# Patient Record
Sex: Female | Born: 1980 | Race: Black or African American | Hispanic: No | Marital: Single | Smoking: Current every day smoker
Health system: Southern US, Community
[De-identification: ages and names within clinical notes are randomized; demographics above are authoritative.]

## PROBLEM LIST (undated history)

## (undated) ENCOUNTER — Emergency Department (HOSPITAL_COMMUNITY): Payer: Managed Care, Other (non HMO) | Source: Home / Self Care

## (undated) DIAGNOSIS — I10 Essential (primary) hypertension: Secondary | ICD-10-CM

## (undated) DIAGNOSIS — L309 Dermatitis, unspecified: Secondary | ICD-10-CM

## (undated) HISTORY — PX: KNEE SURGERY: SHX244

---
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
## 2002-01-26 ENCOUNTER — Emergency Department (HOSPITAL_COMMUNITY): Admission: EM | Admit: 2002-01-26 | Discharge: 2002-01-26 | Payer: Self-pay | Admitting: Emergency Medicine

## 2003-03-19 ENCOUNTER — Emergency Department (HOSPITAL_COMMUNITY): Admission: EM | Admit: 2003-03-19 | Discharge: 2003-03-19 | Payer: Self-pay

## 2003-10-16 ENCOUNTER — Emergency Department (HOSPITAL_COMMUNITY): Admission: EM | Admit: 2003-10-16 | Discharge: 2003-10-16 | Payer: Self-pay | Admitting: Emergency Medicine

## 2005-03-18 ENCOUNTER — Emergency Department (HOSPITAL_COMMUNITY): Admission: EM | Admit: 2005-03-18 | Discharge: 2005-03-18 | Payer: Self-pay | Admitting: Emergency Medicine

## 2005-11-04 ENCOUNTER — Emergency Department (HOSPITAL_COMMUNITY): Admission: EM | Admit: 2005-11-04 | Discharge: 2005-11-04 | Payer: Self-pay | Admitting: Emergency Medicine

## 2008-03-07 ENCOUNTER — Emergency Department (HOSPITAL_COMMUNITY): Admission: EM | Admit: 2008-03-07 | Discharge: 2008-03-08 | Payer: Self-pay | Admitting: Emergency Medicine

## 2011-08-19 ENCOUNTER — Emergency Department (HOSPITAL_COMMUNITY)
Admission: EM | Admit: 2011-08-19 | Discharge: 2011-08-19 | Disposition: A | Discharge status: Home / Self Care | Payer: Managed Care, Other (non HMO) | Attending: Emergency Medicine | Admitting: Emergency Medicine

## 2011-08-19 ENCOUNTER — Encounter (HOSPITAL_COMMUNITY): Payer: Self-pay

## 2011-08-19 ENCOUNTER — Emergency Department (HOSPITAL_COMMUNITY): Payer: Managed Care, Other (non HMO)

## 2011-08-19 DIAGNOSIS — N898 Other specified noninflammatory disorders of vagina: Secondary | ICD-10-CM | POA: Insufficient documentation

## 2011-08-19 DIAGNOSIS — R5381 Other malaise: Secondary | ICD-10-CM | POA: Insufficient documentation

## 2011-08-19 DIAGNOSIS — R42 Dizziness and giddiness: Secondary | ICD-10-CM | POA: Insufficient documentation

## 2011-08-19 DIAGNOSIS — N939 Abnormal uterine and vaginal bleeding, unspecified: Secondary | ICD-10-CM

## 2011-08-19 DIAGNOSIS — R10819 Abdominal tenderness, unspecified site: Secondary | ICD-10-CM | POA: Insufficient documentation

## 2011-08-19 DIAGNOSIS — R5383 Other fatigue: Secondary | ICD-10-CM | POA: Insufficient documentation

## 2011-08-19 DIAGNOSIS — R040 Epistaxis: Secondary | ICD-10-CM | POA: Insufficient documentation

## 2011-08-19 HISTORY — DX: Dermatitis, unspecified: L30.9

## 2011-08-19 LAB — URINE MICROSCOPIC-ADD ON

## 2011-08-19 LAB — BASIC METABOLIC PANEL
CO2: 24 mEq/L (ref 19–32)
Chloride: 102 mEq/L (ref 96–112)
Creatinine, Ser: 0.7 mg/dL (ref 0.50–1.10)
GFR calc Af Amer: >90 mL/min (ref >90)
GFR calc non Af Amer: >90 mL/min (ref >90)
Glucose, Bld: 90 mg/dL (ref 70–99)
Potassium: 3.9 mEq/L (ref 3.5–5.1)
Sodium: 135 mEq/L (ref 135–145)

## 2011-08-19 LAB — URINALYSIS, ROUTINE W REFLEX MICROSCOPIC
Glucose, UA: NEGATIVE mg/dL
Nitrite: POSITIVE — AB
Protein, ur: 30 mg/dL — AB
pH: 7 (ref 5.0–8.0)

## 2011-08-19 LAB — CBC
Hemoglobin: 12.4 g/dL (ref 12.0–15.0)
MCH: 27.4 pg (ref 26.0–34.0)
MCHC: 34.3 g/dL (ref 30.0–36.0)
Platelets: 248 10*3/uL (ref 150–400)
RBC: 4.53 MIL/uL (ref 3.87–5.11)

## 2011-08-19 LAB — DIFFERENTIAL
Basophils Relative: 0 % (ref 0–1)
Eosinophils Absolute: 0 10*3/uL (ref 0.0–0.7)
Lymphocytes Relative: 29 % (ref 12–46)
Monocytes Relative: 26 % — ABNORMAL HIGH (ref 3–12)
Neutro Abs: 1.1 10*3/uL — ABNORMAL LOW (ref 1.7–7.7)
Neutrophils Relative %: 44 % (ref 43–77)

## 2011-08-19 LAB — PREGNANCY, URINE: Preg Test, Ur: NEGATIVE

## 2011-08-19 MED ORDER — MEDROXYPROGESTERONE ACETATE 5 MG PO TABS: 10.0000 mg | ORAL_TABLET | Freq: Every day | ORAL | Status: DC

## 2011-08-19 MED ORDER — OXYCODONE-ACETAMINOPHEN 5-325 MG PO TABS
2.0000 | ORAL_TABLET | Freq: Once | ORAL | Status: AC
  Administered 2011-08-19: 2 via ORAL
  Filled 2011-08-19: qty 2

## 2011-08-19 MED ORDER — MORPHINE SULFATE 4 MG/ML IJ SOLN
4.0000 mg | Freq: Once | INTRAMUSCULAR | Status: AC
  Administered 2011-08-19: 4 mg via INTRAMUSCULAR
  Filled 2011-08-19: qty 1

## 2011-08-19 NOTE — ED Notes (Addendum)
Pt to ultrasound  Pt alert and oriented x4. Respirations even and unlabored, bilateral symmetrical rise and fall of chest. Skin warm and dry. In no acute distress. Denies needs.   

## 2011-08-19 NOTE — ED Provider Notes (Signed)
History     CSN: [id number]  Arrival date & time 08/19/11  1246   First MD Initiated Contact with Patient 08/19/11 1505      Chief Complaint  Patient presents with  . Vaginal Bleeding  . Epistaxis    (Consider location/radiation/quality/duration/timing/severity/associated sxs/prior treatment) Patient is a 31 y.o. female presenting with vaginal bleeding and nosebleeds. The history is provided by the patient.  Vaginal Bleeding Associated symptoms include abdominal pain. Pertinent negatives include no shortness of breath.  Epistaxis    patient is a 31 year old, female, complains of lower abdominal pain, back pain, and vaginal bleeding.  She states that she had 3 weeks of continuous vaginal bleeding.  That stopped for a week and then recurred today.  She also had a single episode of epistaxis that has resolved.  She says that she used a tampon and 3 panty liners.  They became soaked and then, she had bleeding that went down her legs.  She changed her close and then it recurred again.  Therefore, she came to the emergency department for evaluation.  She denies shortness of breath.  She has had a similar episode a few years ago.  She denies nausea, vomiting, fevers, chills, or dysuria, hematuria, or for blood in her stool.  She has asthma and eczema, but no other past medical history.  She takes acetaminophen for pain, but not any nonsteroidal medicines aspirin or anticoagulants.  She denies a history of endometriosis, fibroids.  She states that her mother and sisters do not have any known gynecological problems  Past Medical History  Diagnosis Date  . Asthma   . Eczema     Past Surgical History  Procedure Date  . Knee surgery   . Cesarean section     History reviewed. No pertinent family history.  History  Substance Use Topics  . Smoking status: Current Everyday Smoker  . Smokeless tobacco: Not on file  . Alcohol Use: Yes     occasionally    OB History    Grav Para Term  Preterm Abortions TAB SAB Ect Mult Living                  Review of Systems  Constitutional: Negative for fever and chills.  HENT: Positive for nosebleeds.   Respiratory: Negative for cough, choking and shortness of breath.   Gastrointestinal: Positive for abdominal pain. Negative for nausea, vomiting and blood in stool.  Genitourinary: Positive for vaginal bleeding. Negative for vaginal discharge and vaginal pain.  Musculoskeletal: Positive for back pain.  Skin: Negative for rash.  Neurological: Positive for weakness and light-headedness.  Hematological: Does not bruise/bleed easily.  All other systems reviewed and are negative.    Allergies  Ibuprofen  Home Medications   Current Outpatient Rx  Name Route Sig Dispense Refill  . ACETAMINOPHEN 500 MG PO TABS Oral Take 1,000 mg by mouth every 6 (six) hours as needed. For pain.    Marland Kitchen VITAMIN B-12 1000 MCG PO TABS Oral Take 1,000 mcg by mouth daily.      BP 118/83  Pulse 85  Temp(Src) 98.4 F (36.9 C) (Oral)  Resp 16  SpO2 100%  Physical Exam  Vitals reviewed. Constitutional: She is oriented to person, place, and time. She appears well-developed and well-nourished.  HENT:  Head: Normocephalic and atraumatic.  Eyes: Conjunctivae are normal.  Neck: Normal range of motion. Neck supple.  Cardiovascular: Normal rate.   No murmur heard. Pulmonary/Chest: Effort normal and breath sounds normal. No respiratory  distress.  Abdominal: Soft. She exhibits no distension. There is tenderness. There is no rebound and no guarding.       Mild Bilateral and suprapubic tenderness, with no masses or peritoneal signs  Musculoskeletal: Normal range of motion.  Neurological: She is alert and oriented to person, place, and time.  Skin: Skin is warm and dry.  Psychiatric: She has a normal mood and affect. Thought content normal.    ED Course  Procedures (including critical care time) Profuse vaginal bleeding, with mild lightheadedness, and  mild lower abdominal tenderness.  Will check pregnancy, status, and blood tests, because of the degree of bleeding.  She reports.  Since she has no local OB/GYN, also, we'll do an ultrasound for further evaluation.  She is neither tachycardic nor hypertensive and does not have an acute abdomen so acute intervention is not indicated at this time   Labs Reviewed  PREGNANCY, URINE  URINALYSIS, ROUTINE W REFLEX MICROSCOPIC  CBC  DIFFERENTIAL  BASIC METABOLIC PANEL   No results found.   No diagnosis found.  5:55 PM i spoke with the md on call for gyn, explained presentation, findings on exam, and ancillary tests including [place], blood and neg hcg along with nl vs.   i suggested tx with provera and [facility]  He agreed.    MDM  Vaginal bleeding No pregnancy, hypotension, anemia, distress       Cheri Guppy, MD 08/19/11 1800

## 2011-08-19 NOTE — Discharge Instructions (Signed)
Your blood tests do not show any signs of significant bleeding.  Your ultrasound does not show any tumors masses fibroids or evidence of endometriosis.  Use Provera to stop your bleeding.  Followup with OB/GYN teaching service at the [facility] in [place].  For repeat evaluation and return for worse or uncontrolled symptoms

## 2011-08-19 NOTE — ED Notes (Signed)
Pt verbally aggressive towards dr. Dr explained in detail why naproxen was the correct pain medication for pt to take. Dr explain importance of following up with a specialist. Pt requested copy of all test and results. RN provided pt with results of both ultrasounds and blood work.

## 2011-08-19 NOTE — ED Notes (Signed)
Pt had menstrual cycle 3 weeks ago, pt has continuous vaginal bleeding since then, pt bled through 4 pad and 1 tampon today. Pain at back and pelvic area, lower abdomen, cramping, blood clot unknown, no odor, Denied any urinary symptoms. Pt sexually active.

## 2011-11-01 ENCOUNTER — Encounter (HOSPITAL_COMMUNITY): Payer: Self-pay | Admitting: *Deleted

## 2011-11-01 ENCOUNTER — Emergency Department (HOSPITAL_COMMUNITY)
Admission: EM | Admit: 2011-11-01 | Discharge: 2011-11-01 | Disposition: A | Discharge status: Home / Self Care | Payer: Managed Care, Other (non HMO) | Attending: Emergency Medicine | Admitting: Emergency Medicine

## 2011-11-01 DIAGNOSIS — F17200 Nicotine dependence, unspecified, uncomplicated: Secondary | ICD-10-CM | POA: Insufficient documentation

## 2011-11-01 DIAGNOSIS — L089 Local infection of the skin and subcutaneous tissue, unspecified: Secondary | ICD-10-CM

## 2011-11-01 DIAGNOSIS — R21 Rash and other nonspecific skin eruption: Secondary | ICD-10-CM | POA: Insufficient documentation

## 2011-11-01 DIAGNOSIS — K5900 Constipation, unspecified: Secondary | ICD-10-CM

## 2011-11-01 LAB — CBC
HCT: 37.5 % (ref 36.0–46.0)
Hemoglobin: 12.4 g/dL (ref 12.0–15.0)
MCH: 27.8 pg (ref 26.0–34.0)
MCHC: 33.1 g/dL (ref 30.0–36.0)
MCV: 84.1 fL (ref 78.0–100.0)
Platelets: 268 10*3/uL (ref 150–400)
RBC: 4.46 MIL/uL (ref 3.87–5.11)
RDW: 13.7 % (ref 11.5–15.5)
WBC: 11.6 10*3/uL — ABNORMAL HIGH (ref 4.0–10.5)

## 2011-11-01 LAB — BASIC METABOLIC PANEL
BUN: 11 mg/dL (ref 6–23)
CO2: 22 mEq/L (ref 19–32)
Calcium: 9.2 mg/dL (ref 8.4–10.5)
Chloride: 101 mEq/L (ref 96–112)
Creatinine, Ser: 0.78 mg/dL (ref 0.50–1.10)
GFR calc Af Amer: >90 mL/min (ref >90)
GFR calc non Af Amer: >90 mL/min (ref >90)
Glucose, Bld: 91 mg/dL (ref 70–99)
Potassium: 4.5 mEq/L (ref 3.5–5.1)
Sodium: 134 mEq/L — ABNORMAL LOW (ref 135–145)

## 2011-11-01 LAB — URINALYSIS, ROUTINE W REFLEX MICROSCOPIC
Bilirubin Urine: NEGATIVE
Glucose, UA: NEGATIVE mg/dL
Hgb urine dipstick: NEGATIVE
Ketones, ur: NEGATIVE mg/dL
Nitrite: NEGATIVE
Protein, ur: NEGATIVE mg/dL
Specific Gravity, Urine: 1.01 (ref 1.005–1.030)
Urobilinogen, UA: 0.2 mg/dL (ref 0.0–1.0)
pH: 7.5 (ref 5.0–8.0)

## 2011-11-01 LAB — URINE MICROSCOPIC-ADD ON

## 2011-11-01 LAB — PREGNANCY, URINE: Preg Test, Ur: NEGATIVE

## 2011-11-01 MED ORDER — PREDNISONE 50 MG PO TABS: 50.0000 mg | ORAL_TABLET | Freq: Every day | ORAL | Status: AC

## 2011-11-01 MED ORDER — SODIUM CHLORIDE 0.9 % IV BOLUS (SEPSIS)
1000.0000 mL | Freq: Once | INTRAVENOUS | Status: AC
  Administered 2011-11-01: 1000 mL via INTRAVENOUS

## 2011-11-01 MED ORDER — LACTULOSE 10 GM/15ML PO SOLN: 10.0000 g | Freq: Two times a day (BID) | ORAL | Status: AC

## 2011-11-01 MED ORDER — DOCUSATE SODIUM 100 MG PO CAPS: 100.0000 mg | ORAL_CAPSULE | Freq: Two times a day (BID) | ORAL | Status: AC

## 2011-11-01 MED ORDER — DOXYCYCLINE HYCLATE 100 MG PO CAPS: 100.0000 mg | ORAL_CAPSULE | Freq: Two times a day (BID) | ORAL | Status: AC

## 2011-11-01 NOTE — ED Notes (Addendum)
Pt states that she was told by another physician that she has some rare form of chicken pox that you get from a sandbox, PA notified.

## 2011-11-01 NOTE — ED Notes (Signed)
Patient is alert and orietned x3.  She is complaining of breakout rash on her face. A secondary complaint of constipation.  Last BM was a small one this morning but She states that she has not had a BM in almost 2 weeks.   A third complaint of multiple boils on her buttocks.  She current states she has  Lower abdominal pain rated 7 of 10 with no radiation of pain with no nausea

## 2011-11-01 NOTE — ED Provider Notes (Signed)
History     CSN: [id number]  Arrival date & time 11/01/11  2952   First MD Initiated Contact with Patient 11/01/11 [phone]      Chief Complaint  Patient presents with  . Rash    face  . Constipation  . Recurrent Skin Infections    Boil    (Consider location/radiation/quality/duration/timing/severity/associated sxs/prior treatment) HPI Patient presents to the emergency department with multiple complaints.  Patient, states that she is having a rash to her bilateral cheeks for the last 3 days else is, sick.  She has not had a bowel movement in the last 2 weeks.  She also states she has, what she feels like are forming boils on her buttocks bilaterally.  Patient denies, fevers, chest pain, shortness of breath, weakness, nausea, vomiting, diarrhea, abdominal pain, or dizziness.  Patient, states she has had a few small bowel movements, but not normal for her.  Patient, is, actually having pain in the inguinal region bilaterally, not in her abdomen. Past Medical History  Diagnosis Date  . Asthma   . Eczema     Past Surgical History  Procedure Date  . Knee surgery   . Cesarean section     History reviewed. No pertinent family history.  History  Substance Use Topics  . Smoking status: Current Everyday Smoker  . Smokeless tobacco: Not on file  . Alcohol Use: Yes     occasionally    OB History    Grav Para Term Preterm Abortions TAB SAB Ect Mult Living                  Review of Systems  Constitutional: Positive for chills. Negative for fever and fatigue.  HENT: Negative for neck stiffness.   Respiratory: Negative for shortness of breath.   Cardiovascular: Negative for chest pain.  Gastrointestinal: Positive for constipation. Negative for nausea, vomiting, abdominal pain, diarrhea, abdominal distention and anal bleeding.  Genitourinary: Negative for dysuria, urgency, frequency and flank pain.  Skin: Positive for rash.  Neurological: Negative for dizziness.    Allergies    Brompheniramine  Home Medications   Current Outpatient Rx  Name Route Sig Dispense Refill  . ACETAMINOPHEN 500 MG PO TABS Oral Take 1,000 mg by mouth every 6 (six) hours as needed. For pain.    Marland Kitchen VITAMIN B-12 1000 MCG PO TABS Oral Take 1,000 mcg by mouth daily.      BP 118/83  Pulse 94  Temp 99 F (37.2 C) (Oral)  Resp 18  SpO2 100%  LMP 10/24/2011  Physical Exam  Constitutional: She is oriented to person, place, and time. She appears well-developed and well-nourished. No distress.  HENT:  Head: Normocephalic and atraumatic.  Eyes: Pupils are equal, round, and reactive to light.  Cardiovascular: Normal rate, regular rhythm and normal heart sounds.  Exam reveals no friction rub.   No murmur heard. Pulmonary/Chest: Effort normal and breath sounds normal.  Abdominal: Soft. Bowel sounds are normal. She exhibits no distension. There is no tenderness. There is no rebound and no guarding.  Lymphadenopathy:       Right: Inguinal adenopathy present.       Left: Inguinal adenopathy present.  Neurological: She is alert and oriented to person, place, and time.  Skin: Skin is warm and dry. Rash noted.       ED Course  Procedures (including critical care time)  Labs Reviewed  CBC - Abnormal; Notable for the following:    WBC 11.6 (*)  All other components within normal limits  BASIC METABOLIC PANEL - Abnormal; Notable for the following:    Sodium 134 (*)     All other components within normal limits  URINALYSIS, ROUTINE W REFLEX MICROSCOPIC - Abnormal; Notable for the following:    Leukocytes, UA SMALL (*)     All other components within normal limits  PREGNANCY, URINE  URINE MICROSCOPIC-ADD ON    The patient will be treated for possible skin infection along with the rash on her face. The patient has been stable here in the ER. She is advised to increase her fluid intake. Told to return here as needed. Patient given IV fluids to help with her constipation.The patient has  tried OTC laxatives but has gotten no relief.   MDM          Carlyle Dolly, PA-C 11/01/11 1058

## 2011-11-04 NOTE — ED Provider Notes (Signed)
Medical screening examination/treatment/procedure(s) were performed by non-physician practitioner and as supervising physician I was immediately available for consultation/collaboration.  Derwood Kaplan, MD 11/04/11 1850

## 2011-11-04 NOTE — ED Provider Notes (Signed)
Medical screening examination/treatment/procedure(s) were performed by non-physician practitioner and as supervising physician I was immediately available for consultation/collaboration.  Derwood Kaplan, MD 11/04/11 1906

## 2012-03-13 ENCOUNTER — Encounter (HOSPITAL_COMMUNITY): Payer: Self-pay | Admitting: Emergency Medicine

## 2012-03-13 ENCOUNTER — Emergency Department (HOSPITAL_COMMUNITY): Payer: Managed Care, Other (non HMO)

## 2012-03-13 ENCOUNTER — Emergency Department (HOSPITAL_COMMUNITY)
Admission: EM | Admit: 2012-03-13 | Discharge: 2012-03-13 | Disposition: A | Discharge status: Home / Self Care | Payer: Managed Care, Other (non HMO) | Attending: Emergency Medicine | Admitting: Emergency Medicine

## 2012-03-13 DIAGNOSIS — Z872 Personal history of diseases of the skin and subcutaneous tissue: Secondary | ICD-10-CM | POA: Insufficient documentation

## 2012-03-13 DIAGNOSIS — R071 Chest pain on breathing: Secondary | ICD-10-CM | POA: Insufficient documentation

## 2012-03-13 DIAGNOSIS — R0789 Other chest pain: Secondary | ICD-10-CM

## 2012-03-13 DIAGNOSIS — J45909 Unspecified asthma, uncomplicated: Secondary | ICD-10-CM | POA: Insufficient documentation

## 2012-03-13 DIAGNOSIS — A599 Trichomoniasis, unspecified: Secondary | ICD-10-CM

## 2012-03-13 DIAGNOSIS — Z79899 Other long term (current) drug therapy: Secondary | ICD-10-CM | POA: Insufficient documentation

## 2012-03-13 DIAGNOSIS — F17200 Nicotine dependence, unspecified, uncomplicated: Secondary | ICD-10-CM | POA: Insufficient documentation

## 2012-03-13 LAB — URINALYSIS, ROUTINE W REFLEX MICROSCOPIC
Nitrite: NEGATIVE
Specific Gravity, Urine: 1.028 (ref 1.005–1.030)
pH: 6.5 (ref 5.0–8.0)

## 2012-03-13 LAB — URINE MICROSCOPIC-ADD ON

## 2012-03-13 MED ORDER — SODIUM CHLORIDE 0.9 % IV SOLN: INTRAVENOUS | Status: DC

## 2012-03-13 MED ORDER — PANTOPRAZOLE SODIUM 40 MG IV SOLR
40.0000 mg | Freq: Once | INTRAVENOUS | Status: DC
  Filled 2012-03-13: qty 40

## 2012-03-13 MED ORDER — METRONIDAZOLE 500 MG PO TABS
2000.0000 mg | ORAL_TABLET | Freq: Once | ORAL | Status: AC
  Administered 2012-03-13: 2000 mg via ORAL
  Filled 2012-03-13: qty 4

## 2012-03-13 NOTE — ED Notes (Signed)
Pt refusing needles and lab.  Labs accidentally clicked off in error. Were not collected.

## 2012-03-13 NOTE — ED Provider Notes (Signed)
History     CSN: [id number]  Arrival date & time 03/13/12  1620   First MD Initiated Contact with Patient 03/13/12 1728      Chief Complaint  Patient presents with  . Abdominal Pain    (Consider location/radiation/quality/duration/timing/severity/associated sxs/prior treatment) Patient is a 31 y.o. female presenting with abdominal pain. The history is provided by the patient.  Abdominal Pain The primary symptoms of the illness include abdominal pain.   patient here with left upper quadrant pain x3 days which is become worse. No associated fever, chills, nausea or vomiting. Had a normal bowel movement today. Pain is at her left costal margin and is worse with certain positions. Denies a history of trauma. Denies any dyspnea, shortness of breath, pleuritic chest pain. No prior history of same in no medications taken for this prior to arrival  Past Medical History  Diagnosis Date  . Asthma   . Eczema     Past Surgical History  Procedure Date  . Knee surgery   . Cesarean section     No family history on file.  History  Substance Use Topics  . Smoking status: Current Every Day Smoker -- 0.5 packs/day    Types: Cigarettes  . Smokeless tobacco: Never Used  . Alcohol Use: Yes     Comment: occasionally every Saturday, vodka 3 drinks    OB History    Grav Para Term Preterm Abortions TAB SAB Ect Mult Living                  Review of Systems  Gastrointestinal: Positive for abdominal pain.  All other systems reviewed and are negative.    Allergies  Brompheniramine  Home Medications   Current Outpatient Rx  Name  Route  Sig  Dispense  Refill  . ACETAMINOPHEN 500 MG PO TABS   Oral   Take 1,000 mg by mouth every 6 (six) hours as needed. For pain.           BP 138/92  Pulse 66  Temp 98 F (36.7 C) (Oral)  Resp 14  SpO2 100%  LMP 02/08/2012  Physical Exam  Nursing note and vitals reviewed. Constitutional: She is oriented to person, place, and time. She  appears well-developed and well-nourished.  Non-toxic appearance. No distress.  HENT:  Head: Normocephalic and atraumatic.  Eyes: Conjunctivae normal, EOM and lids are normal. Pupils are equal, round, and reactive to light.  Neck: Normal range of motion. Neck supple. No tracheal deviation present. No mass present.  Cardiovascular: Normal rate, regular rhythm and normal heart sounds.  Exam reveals no gallop.   No murmur heard. Pulmonary/Chest: Effort normal and breath sounds normal. No stridor. No respiratory distress. She has no decreased breath sounds. She has no wheezes. She has no rhonchi. She has no rales.  Abdominal: Soft. Normal appearance and bowel sounds are normal. She exhibits no distension. There is tenderness in the left upper quadrant. There is no rigidity, no rebound, no guarding and no CVA tenderness.    Musculoskeletal: Normal range of motion. She exhibits no edema and no tenderness.  Neurological: She is alert and oriented to person, place, and time. She has normal strength. No cranial nerve deficit or sensory deficit. GCS eye subscore is 4. GCS verbal subscore is 5. GCS motor subscore is 6.  Skin: Skin is warm and dry. No abrasion and no rash noted.  Psychiatric: She has a normal mood and affect. Her speech is normal and behavior is normal.  ED Course  Procedures (including critical care time)  Labs Reviewed - No data to display No results found.   No diagnosis found.    MDM  Patient is refusing blood work at this time. She does have pain at her left costal margin. I suspect that she likely  has costochondritis. I informed patient that I cannot rule out other serious pathology and she accepts this. She was given Flagyl 2 g by mouth for her Trichomonas. She states that she will followup with health department for further STD testing.        Toy Baker, MD 03/13/12 2011

## 2012-03-13 NOTE — ED Notes (Signed)
Pt presents w/ left upper quadrant pain, taking Pepto-Bismol in case it was an ulcer for 10 days. Took additional Zantac for a couple of days w/o relief. Now pt has palpable, enlarged, painful area LUQ, unable to lie on abdomen d/t pain. Denies fever, chills, nausea or emesis. Has been taking stool softeners as instructed to aid w/ constipation.

## 2012-03-13 NOTE — ED Notes (Signed)
She tells me she is feeling better; and wishes to undergo treatment her "without any needles" if possible.  Dr. Freida Busman notified, and states he will speak to her at length after her x-ray, to which she is amenable.

## 2012-03-13 NOTE — ED Notes (Signed)
She tells me she's had pain and some swelling at ant. lower left ribs area x 1-2 days, but it may be a bit more than that--she's not sure.  She denies fever/n/v/d and is in no distress.  She states she had a "cold" about a month ago, but has basically recovered from that.  She c/o increase in pain with inspiration/expiration, and with palpation.

## 2012-03-16 ENCOUNTER — Ambulatory Visit (INDEPENDENT_AMBULATORY_CARE_PROVIDER_SITE_OTHER): Payer: Managed Care, Other (non HMO) | Admitting: Family Medicine

## 2012-03-16 ENCOUNTER — Encounter: Payer: Self-pay | Admitting: Family Medicine

## 2012-03-16 VITALS — BP 120/80 | HR 90 | Temp 98.5°F | Wt 150.0 lb

## 2012-03-16 DIAGNOSIS — K30 Functional dyspepsia: Secondary | ICD-10-CM

## 2012-03-16 DIAGNOSIS — Z7251 High risk heterosexual behavior: Secondary | ICD-10-CM

## 2012-03-16 DIAGNOSIS — N920 Excessive and frequent menstruation with regular cycle: Secondary | ICD-10-CM

## 2012-03-16 DIAGNOSIS — M940 Chondrocostal junction syndrome [Tietze]: Secondary | ICD-10-CM

## 2012-03-16 DIAGNOSIS — R1013 Epigastric pain: Secondary | ICD-10-CM

## 2012-03-16 DIAGNOSIS — Z720 Tobacco use: Secondary | ICD-10-CM

## 2012-03-16 DIAGNOSIS — F17200 Nicotine dependence, unspecified, uncomplicated: Secondary | ICD-10-CM

## 2012-03-16 DIAGNOSIS — K3189 Other diseases of stomach and duodenum: Secondary | ICD-10-CM

## 2012-03-16 DIAGNOSIS — Z13220 Encounter for screening for lipoid disorders: Secondary | ICD-10-CM

## 2012-03-16 DIAGNOSIS — Z131 Encounter for screening for diabetes mellitus: Secondary | ICD-10-CM

## 2012-03-16 LAB — HEMOGLOBIN A1C: Hgb A1c MFr Bld: 5.6 % (ref 4.6–6.5)

## 2012-03-16 LAB — CBC WITH DIFFERENTIAL/PLATELET
Basophils Absolute: 0 10*3/uL (ref 0.0–0.1)
Eosinophils Absolute: 0.1 10*3/uL (ref 0.0–0.7)
HCT: 40.1 % (ref 36.0–46.0)
Lymphs Abs: 2 10*3/uL (ref 0.7–4.0)
Monocytes Relative: 12.8 % — ABNORMAL HIGH (ref 3.0–12.0)
Platelets: 264 10*3/uL (ref 150.0–400.0)
RDW: 14.5 % (ref 11.5–14.6)

## 2012-03-16 NOTE — Patient Instructions (Addendum)
-  We have ordered labs or studies at this visit. It can take up to 1-2 weeks for results and processing. We will contact you with instructions IF your results are abnormal. Normal results will be released to your [facility]. If you have not heard from [place] or can not find your results in [facility] in 2 weeks please contact our office.  -PLEASE SIGN UP FOR MYCHART TODAY   We recommend the following healthy lifestyle measures: - eat a healthy diet consisting of lots of vegetables, fruits, beans, nuts, seeds, healthy meats such as white chicken and fish and whole grains.  - avoid fried foods, fast food, processed foods, sodas, red meet and other fattening foods.  - get a least 150 minutes of aerobic exercise per week.   Follow up in: 1 month for smoking cessation   Costochondritis Costochondritis (Tietze syndrome), or costochondral separation, is a swelling and irritation (inflammation) of the tissue (cartilage) that connects your ribs with your breastbone (sternum). It may occur on its own (spontaneously), through damage caused by an accident (trauma), or simply from coughing or minor exercise. It may take up to 6 weeks to get better and longer if you are unable to be conservative in your activities. HOME CARE INSTRUCTIONS   Avoid exhausting physical activity. Try not to strain your ribs during normal activity. This would include any activities using chest, belly (abdominal), and side muscles, especially if heavy weights are used.  Use ice for 15 to 20 minutes per hour while awake for the first 2 days. Place the ice in a plastic bag, and place a towel between the bag of ice and your skin.  Only take over-the-counter or prescription medicines for pain, discomfort, or fever as directed by your caregiver. SEEK IMMEDIATE MEDICAL CARE IF:   You have a fever.  You develop difficulty with your breathing.  You cough up blood.  You develop worse chest pains, shortness of breath, sweating, or  vomiting.  You develop new, unexplained problems (symptoms). MAKE SURE YOU:   Understand these instructions.  Will watch your condition.  Will get help right away if you are not doing well or get worse. Document Released: 01/07/2005 Document Revised: 06/22/2011 Document Reviewed: 11/16/2007 [facility] Patient Information 2013 [place], [place].

## 2012-03-16 NOTE — Progress Notes (Signed)
Chief Complaint  Patient presents with  . Establish Care    HPI:  Cathy Hill is here to establish care. Just recently moved to [place]. 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 Home Situation: lives with sisters and daughters Spiritual Beliefs: none  Has the following chronic problems and concerns today:  Seen in ED 03/13/12 for LUQ pain: -thought to be costochondritis given location in left costal margin and reproducible, better with tylenol -given flagyl also for trichomonas - one month ago had some discomfort in epigastric region for about 1 month when eating - then this got better, -no reflux, dysphagia, weight loss, fevers, choking, blood in stools, diarrhea, nausea, vomiting, fatigue -wonders if could have infection in stomach  Tobacco Use: -1/2 pack per day -would like to stop, but doen't know if she is quite ready or can  Other Providers: -she is going to establish with gyn for her very heavy periods ongoing  Health Maintenance: Had tdap 3 years ago  ROS: See pertinent positives and negatives per HPI.  Past Medical History  Diagnosis Date  . Asthma   . Eczema     No family history on file.  History   Social History  . Marital Status: Married    Spouse Name: N/A    Number of Children: N/A  . Years of Education: N/A   Social History Main Topics  . Smoking status: Current Every Day Smoker -- 0.5 packs/day    Types: Cigarettes  . Smokeless tobacco: Never Used  . Alcohol Use: Yes     Comment: occasionally every Saturday, vodka 3 drinks  . Drug Use: No  . Sexually Active: Yes    Birth Control/ Protection: None, Condom   Other Topics Concern  . None   Social History Narrative  . None    Current outpatient prescriptions:acetaminophen (TYLENOL) 500 MG tablet, Take 1,000 mg by mouth every 6 (six) hours as needed. For pain.,  Disp: , Rfl: ;  bismuth subsalicylate (PEPTO BISMOL) 262 MG/15ML suspension, Take 30 mLs by mouth every 6 (six) hours as needed. For stomach upset, Disp: , Rfl: ;  docusate sodium (COLACE) 100 MG capsule, Take 100 mg by mouth 2 (two) times daily as needed. For stool softener, Disp: , Rfl:  hydrocortisone cream 1 %, Apply 1 application topically 2 (two) times daily. Apply to eczema, Disp: , Rfl: ;  ranitidine (ZANTAC) 150 MG tablet, Take 150 mg by mouth 2 (two) times daily as needed. Stomach upset, Disp: , Rfl:   EXAM:  Filed Vitals:   03/16/12 1128  BP: 120/80  Pulse: 90  Temp: 98.5 F (36.9 C)    There is no height on file to calculate BMI.  GENERAL: vitals reviewed and listed above, alert, oriented, appears well hydrated and in no acute distress  HEENT: atraumatic, conjunttiva clear, no obvious abnormalities on inspection of external nose and ears  NECK: no obvious masses on inspection  LUNGS: clear to auscultation bilaterally, no wheezes, rales or rhonchi, good air movement  CV: HRRR, no peripheral edema  MS: moves all extremities without noticeable abnormality, TTP L lower costal cartilage  PSYCH: pleasant and cooperative, no obvious depression or anxiety  ASSESSMENT AND PLAN:  Discussed the following assessment and plan:  1. Costochondritis    2. Tobacco use    3. Menorrhagia  CBC with Differential  4. Indigestion  H.  pylori Antibody, IgG  5. Screening for diabetes mellitus  Hemoglobin A1c  6. Screening for hyperlipidemia  Lipid Panel  7. High risk sexual behavior  HIV Antibody, RPR, GC/Chlamydia Amp Probe, Urine, Hep B Surface Antigen, Hepatitis B core antibody, total   FASTING LABS  -We reviewed the PMH, PSH, FH, SH, Meds and Allergies. -We provided refills for any medications we will prescribe as needed. -We addressed current concerns per orders and patient instructions. -We have asked for records for pertinent exams, studies, vaccines and notes from previous  providers. -We have advised patient to follow up per instructions below. -Influenza vaccine given today  -Patient advised to return or notify a doctor immediately if symptoms worsen or persist or new concerns arise.  Patient Instructions  -We have ordered labs or studies at this visit. It can take up to 1-2 weeks for results and processing. We will contact you with instructions IF your results are abnormal. Normal results will be released to your [facility]. If you have not heard from [place] or can not find your results in [facility] in 2 weeks please contact our office.  -PLEASE SIGN UP FOR MYCHART TODAY   We recommend the following healthy lifestyle measures: - eat a healthy diet consisting of lots of vegetables, fruits, beans, nuts, seeds, healthy meats such as white chicken and fish and whole grains.  - avoid fried foods, fast food, processed foods, sodas, red meet and other fattening foods.  - get a least 150 minutes of aerobic exercise per week.   Follow up in: 1 month for smoking cessation   Costochondritis Costochondritis (Tietze syndrome), or costochondral separation, is a swelling and irritation (inflammation) of the tissue (cartilage) that connects your ribs with your breastbone (sternum). It may occur on its own (spontaneously), through damage caused by an accident (trauma), or simply from coughing or minor exercise. It may take up to 6 weeks to get better and longer if you are unable to be conservative in your activities. HOME CARE INSTRUCTIONS   Avoid exhausting physical activity. Try not to strain your ribs during normal activity. This would include any activities using chest, belly (abdominal), and side muscles, especially if heavy weights are used.  Use ice for 15 to 20 minutes per hour while awake for the first 2 days. Place the ice in a plastic bag, and place a towel between the bag of ice and your skin.  Only take over-the-counter or prescription medicines for pain, discomfort,  or fever as directed by your caregiver. SEEK IMMEDIATE MEDICAL CARE IF:   You have a fever.  You develop difficulty with your breathing.  You cough up blood.  You develop worse chest pains, shortness of breath, sweating, or vomiting.  You develop new, unexplained problems (symptoms). MAKE SURE YOU:   Understand these instructions.  Will watch your condition.  Will get help right away if you are not doing well or get worse. Document Released: 01/07/2005 Document Revised: 06/22/2011 Document Reviewed: 11/16/2007 [facility] Patient Information 2013 [place], [place], [place].

## 2012-03-17 LAB — H. PYLORI ANTIBODY, IGG: H Pylori IgG: NEGATIVE

## 2012-03-17 LAB — GC/CHLAMYDIA PROBE AMP, URINE: GC Probe Amp, Urine: NEGATIVE

## 2012-03-17 LAB — LIPID PANEL
Cholesterol: 146 mg/dL (ref 0–200)
LDL Cholesterol: 80 mg/dL (ref 0–99)
VLDL: 8.4 mg/dL (ref 0.0–40.0)

## 2012-03-17 LAB — HIV ANTIBODY (ROUTINE TESTING W REFLEX): HIV: NONREACTIVE

## 2012-03-18 ENCOUNTER — Telehealth: Payer: Self-pay

## 2012-03-18 NOTE — Telephone Encounter (Signed)
Called to give pt lab results and pt states the was not given any medication at the ER for the STD she was told she had.  Pt states she is still having pain and discharge.  Pt is aware Dr. Selena Batten is out of the office until Monday.  Pls advise if pt needs to have an rx sent to [facility] [address]

## 2012-03-18 NOTE — Telephone Encounter (Signed)
Advised pt of Dr. Elmyra Ricks recommendations.

## 2012-03-18 NOTE — Progress Notes (Signed)
Quick Note:  Called and spoke with pt and pt is aware. ______ 

## 2012-03-18 NOTE — Telephone Encounter (Signed)
Called and spoke with pt and pt states she will call on Monday if she needs an appt.

## 2012-03-18 NOTE — Telephone Encounter (Signed)
Doctor and nurse notes report she was given medication in the ED. If she continues to have symptoms should schedule appt and we will do an exam/retest.

## 2012-12-04 ENCOUNTER — Emergency Department (HOSPITAL_COMMUNITY)
Admission: EM | Admit: 2012-12-04 | Discharge: 2012-12-04 | Disposition: A | Discharge status: Home / Self Care | Payer: Managed Care, Other (non HMO) | Attending: Emergency Medicine | Admitting: Emergency Medicine

## 2012-12-04 ENCOUNTER — Encounter (HOSPITAL_COMMUNITY): Payer: Self-pay | Admitting: Cardiology

## 2012-12-04 ENCOUNTER — Emergency Department (HOSPITAL_COMMUNITY): Payer: Managed Care, Other (non HMO)

## 2012-12-04 DIAGNOSIS — F17200 Nicotine dependence, unspecified, uncomplicated: Secondary | ICD-10-CM | POA: Insufficient documentation

## 2012-12-04 DIAGNOSIS — Z872 Personal history of diseases of the skin and subcutaneous tissue: Secondary | ICD-10-CM | POA: Insufficient documentation

## 2012-12-04 DIAGNOSIS — W010XXA Fall on same level from slipping, tripping and stumbling without subsequent striking against object, initial encounter: Secondary | ICD-10-CM | POA: Insufficient documentation

## 2012-12-04 DIAGNOSIS — Y9389 Activity, other specified: Secondary | ICD-10-CM | POA: Insufficient documentation

## 2012-12-04 DIAGNOSIS — Y929 Unspecified place or not applicable: Secondary | ICD-10-CM | POA: Insufficient documentation

## 2012-12-04 DIAGNOSIS — S6291XA Unspecified fracture of right wrist and hand, initial encounter for closed fracture: Secondary | ICD-10-CM

## 2012-12-04 DIAGNOSIS — Z202 Contact with and (suspected) exposure to infections with a predominantly sexual mode of transmission: Secondary | ICD-10-CM | POA: Insufficient documentation

## 2012-12-04 DIAGNOSIS — J45909 Unspecified asthma, uncomplicated: Secondary | ICD-10-CM | POA: Insufficient documentation

## 2012-12-04 DIAGNOSIS — S62339A Displaced fracture of neck of unspecified metacarpal bone, initial encounter for closed fracture: Secondary | ICD-10-CM | POA: Insufficient documentation

## 2012-12-04 MED ORDER — HYDROCODONE-ACETAMINOPHEN 5-325 MG PO TABS: 1.0000 | ORAL_TABLET | Freq: Four times a day (QID) | ORAL | Status: DC | PRN

## 2012-12-04 MED ORDER — OXYCODONE-ACETAMINOPHEN 5-325 MG PO TABS
2.0000 | ORAL_TABLET | Freq: Once | ORAL | Status: AC
  Administered 2012-12-04: 2 via ORAL
  Filled 2012-12-04: qty 2

## 2012-12-04 NOTE — Progress Notes (Signed)
Orthopedic Tech Progress Note Patient Details:  Cathy Hill 22-May-1980 [id number]  Ortho Devices Type of Ortho Device: Ulna gutter splint Ortho Device/Splint Interventions: Application   Cammer, Mickie Bail 12/04/2012, 4:25 PM
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
## 2012-12-05 NOTE — ED Provider Notes (Signed)
History/physical exam/procedure(s) were performed by non-physician practitioner and as supervising physician I was immediately available for consultation/collaboration. I have reviewed all notes and am in agreement with care and plan.   Sommer Spickard S Laylamarie Meuser, MD 12/05/12 1202 

## 2013-11-03 ENCOUNTER — Emergency Department (HOSPITAL_COMMUNITY)
Admission: EM | Admit: 2013-11-03 | Discharge: 2013-11-03 | Disposition: A | Discharge status: Home / Self Care | Payer: Managed Care, Other (non HMO) | Attending: Emergency Medicine | Admitting: Emergency Medicine

## 2013-11-03 ENCOUNTER — Encounter (HOSPITAL_COMMUNITY): Payer: Self-pay | Admitting: Emergency Medicine

## 2013-11-03 DIAGNOSIS — R079 Chest pain, unspecified: Secondary | ICD-10-CM | POA: Insufficient documentation

## 2013-11-03 DIAGNOSIS — F17200 Nicotine dependence, unspecified, uncomplicated: Secondary | ICD-10-CM | POA: Insufficient documentation

## 2013-11-03 DIAGNOSIS — J45909 Unspecified asthma, uncomplicated: Secondary | ICD-10-CM | POA: Insufficient documentation

## 2013-11-03 DIAGNOSIS — K219 Gastro-esophageal reflux disease without esophagitis: Secondary | ICD-10-CM

## 2013-11-03 DIAGNOSIS — Z872 Personal history of diseases of the skin and subcutaneous tissue: Secondary | ICD-10-CM | POA: Insufficient documentation

## 2013-11-03 MED ORDER — PANTOPRAZOLE SODIUM 40 MG PO TBEC
40.0000 mg | DELAYED_RELEASE_TABLET | Freq: Once | ORAL | Status: AC
  Administered 2013-11-03: 40 mg via ORAL
  Filled 2013-11-03: qty 1

## 2013-11-03 MED ORDER — OMEPRAZOLE 40 MG PO CPDR: 40.0000 mg | DELAYED_RELEASE_CAPSULE | Freq: Every day | ORAL | Status: DC

## 2013-11-03 MED ORDER — GI COCKTAIL ~~LOC~~
30.0000 mL | Freq: Once | ORAL | Status: AC
  Administered 2013-11-03: 30 mL via ORAL
  Filled 2013-11-03: qty 30

## 2013-11-03 NOTE — ED Provider Notes (Signed)
TIME SEEN: 6:10 PM  CHIEF COMPLAINT: Chest pain  HPI: Patient is a 33 y.o. F with history of asthma, eczema who presents emergency department with a epigastric and central burning chest pain that started while at work today. She states over the past week she has had some upset stomach and nausea and had one episode of vomiting earlier this morning. She states at work she had pain in her chest. She told her supervisor who instructed her to come to the emergency department with EMS. She denies any shortness of breath. No diarrhea, bloody stools or melena. No history of hypertension, diabetes, hyperlipidemia, family history of premature CAD. No history of PE or DVT, lower extremity swelling or pain, recent fracture, surgery, trauma, exogenous hormone use. Patient is a smoker. She states she has had chest pain like this in the past.  ROS: See HPI Constitutional: no fever  Eyes: no drainage  ENT: no runny nose   Cardiovascular:   chest pain  Resp: no SOB  GI:  vomiting GU: no dysuria Integumentary: no rash  Allergy: no hives  Musculoskeletal: no leg swelling  Neurological: no slurred speech ROS otherwise negative  PAST MEDICAL HISTORY/PAST SURGICAL HISTORY:  Past Medical History  Diagnosis Date  . Asthma   . Eczema     MEDICATIONS:  Prior to Admission medications   Medication Sig Start Date End Date Taking? Authorizing Provider  acetaminophen (TYLENOL) 500 MG tablet Take 1,000 mg by mouth every 6 (six) hours as needed (headache). For pain.    Historical Provider, MD  HYDROcodone-acetaminophen (NORCO) 5-325 MG per tablet Take 1 tablet by mouth every 6 (six) hours as needed for pain. 12/04/12   Kennis CarinaEjiroghene Emokpae, MD  PRESCRIPTION MEDICATION Apply 1 application topically daily as needed (eczema).    Historical Provider, MD    ALLERGIES:  Allergies  Allergen Reactions  . Brompheniramine Other (See Comments)    Cant wake up    SOCIAL HISTORY:  History  Substance Use Topics  .  Smoking status: Current Every Day Smoker -- 0.50 packs/day    Types: Cigarettes  . Smokeless tobacco: Never Used  . Alcohol Use: Yes     Comment: occasionally every Saturday, vodka 3 drinks    FAMILY HISTORY: No family history on file.  EXAM: BP 112/62  Temp(Src) 97.9 F (36.6 C) (Oral)  Resp 17  Ht 5\' 10"  (1.778 m)  Wt 143 lb (64.864 kg)  BMI 20.52 kg/m2  SpO2 100%  LMP 08/04/2013 CONSTITUTIONAL: Alert and oriented and responds appropriately to questions. Well-appearing; well-nourished HEAD: Normocephalic EYES: Conjunctivae clear, PERRL ENT: normal nose; no rhinorrhea; moist mucous membranes; pharynx without lesions noted NECK: Supple, no meningismus, no LAD  CARD: RRR; S1 and S2 appreciated; no murmurs, no clicks, no rubs, no gallops RESP: Normal chest excursion without splinting or tachypnea; breath sounds clear and equal bilaterally; no wheezes, no rhonchi, no rales,  ABD/GI: Normal bowel sounds; non-distended; soft, mildly tender to palpation in the epigastric region, negative Murphy sign, no pulsatile mass BACK:  The back appears normal and is non-tender to palpation, there is no CVA tenderness EXT: Normal ROM in all joints; non-tender to palpation; no edema; normal capillary refill; no cyanosis, equal pulses in all 4 extremities    SKIN: Normal color for age and race; warm NEURO: Moves all extremities equally PSYCH: The patient's mood and manner are appropriate. Grooming and personal hygiene are appropriate.  MEDICAL DECISION MAKING: Patient here with likely gastritis versus GERD. She is well-appearing with  a relatively benign abdominal exam. Negative Murphy sign. Doubt cholecystitis or pancreatitis. Suspect her burning chest pain is related to GERD. We'll give GI cocktail, protonix and reassess. Her EKG shows no ischemic changes or arrhythmia.   ED PROGRESS: Patient's pain is completely gone after Protonix and GI cocktail. I suspect this was gastric in nature. She has no  abdominal pain on exam currently to suggest a perforated ulcer. I feel she is safe to be discharged home on a PPI. Discussed strict return precautions with patient. She verbalizes understanding and is comfortable with plan.        EKG Interpretation  Date/Time:  Friday November 03 2013 17:30:52 EDT Ventricular Rate:  60 PR Interval:  131 QRS Duration: 96 QT Interval:  415 QTC Calculation: 415 R Axis:   72 Text Interpretation:  Sinus rhythm Right atrial enlargement RSR' in V1 or V2, probably normal variant Confirmed by WARD,  DO, KRISTEN [phone]) on 11/03/2013 6:14:25 PM        Layla Maw Ward, DO 11/03/13 1953

## 2013-11-03 NOTE — ED Notes (Signed)
Pt reports she woke up today feeling very tired then while at work around 3pm she started having CP that radiates down into mid-epigastric area and into mid abd. Pt sts she then became very hot/diaphoretic and nauseous then vomited X 1. Pt sts the pain worsens with a deep breath and when she talks loud. Pt in nad, skin warm and dry, resp e/u.

## 2013-11-03 NOTE — Discharge Instructions (Signed)
Gastroesophageal Reflux Disease, Adult  Gastroesophageal reflux disease (GERD) happens when acid from your stomach flows up into the esophagus. When acid comes in contact with the esophagus, the acid causes soreness (inflammation) in the esophagus. Over time, GERD may create small holes (ulcers) in the lining of the esophagus.  CAUSES   · Increased body weight. This puts pressure on the stomach, making acid rise from the stomach into the esophagus.  · Smoking. This increases acid production in the stomach.  · Drinking alcohol. This causes decreased pressure in the lower esophageal sphincter (valve or ring of muscle between the esophagus and stomach), allowing acid from the stomach into the esophagus.  · Late evening meals and a full stomach. This increases pressure and acid production in the stomach.  · A malformed lower esophageal sphincter.  Sometimes, no cause is found.  SYMPTOMS   · Burning pain in the lower part of the mid-chest behind the breastbone and in the mid-stomach area. This may occur twice a week or more often.  · Trouble swallowing.  · Sore throat.  · Dry cough.  · Asthma-like symptoms including chest tightness, shortness of breath, or wheezing.  DIAGNOSIS   Your caregiver may be able to diagnose GERD based on your symptoms. In some cases, X-rays and other tests may be done to check for complications or to check the condition of your stomach and esophagus.  TREATMENT   Your caregiver may recommend over-the-counter or prescription medicines to help decrease acid production. Ask your caregiver before starting or adding any new medicines.   HOME CARE INSTRUCTIONS   · Change the factors that you can control. Ask your caregiver for guidance concerning weight loss, quitting smoking, and alcohol consumption.  · Avoid foods and drinks that make your symptoms worse, such as:  ¨ Caffeine or alcoholic drinks.  ¨ Chocolate.  ¨ Peppermint or mint flavorings.  ¨ Garlic and onions.  ¨ Spicy foods.  ¨ Citrus fruits,  such as oranges, lemons, or limes.  ¨ Tomato-based foods such as sauce, chili, salsa, and pizza.  ¨ Fried and fatty foods.  · Avoid lying down for the 3 hours prior to your bedtime or prior to taking a nap.  · Eat small, frequent meals instead of large meals.  · Wear loose-fitting clothing. Do not wear anything tight around your waist that causes pressure on your stomach.  · Raise the head of your bed 6 to 8 inches with wood blocks to help you sleep. Extra pillows will not help.  · Only take over-the-counter or prescription medicines for pain, discomfort, or fever as directed by your caregiver.  · Do not take aspirin, ibuprofen, or other nonsteroidal anti-inflammatory drugs (NSAIDs).  SEEK IMMEDIATE MEDICAL CARE IF:   · You have pain in your arms, neck, jaw, teeth, or back.  · Your pain increases or changes in intensity or duration.  · You develop nausea, vomiting, or sweating (diaphoresis).  · You develop shortness of breath, or you faint.  · Your vomit is green, yellow, black, or looks like coffee grounds or blood.  · Your stool is red, bloody, or black.  These symptoms could be signs of other problems, such as heart disease, gastric bleeding, or esophageal bleeding.  MAKE SURE YOU:   · Understand these instructions.  · Will watch your condition.  · Will get help right away if you are not doing well or get worse.  Document Released: 01/07/2005 Document Revised: 06/22/2011 Document Reviewed: 10/17/2010  ExitCare® Patient   Information ©2015 [employer]. This information is not intended to replace advice given to you by your health care provider. Make sure you discuss any questions you have with your health care provider.  Food Choices for Gastroesophageal Reflux Disease  When you have gastroesophageal reflux disease (GERD), the foods you eat and your eating habits are very important. Choosing the right foods can help ease the discomfort of GERD.  WHAT GENERAL GUIDELINES DO I NEED TO FOLLOW?  · Choose fruits,  vegetables, whole grains, low-fat dairy products, and low-fat meat, fish, and poultry.  · Limit fats such as oils, salad dressings, butter, nuts, and avocado.  · Keep a food diary to identify foods that cause symptoms.  · Avoid foods that cause reflux. These may be different for different people.  · Eat frequent small meals instead of three large meals each day.  · Eat your meals slowly, in a relaxed setting.  · Limit fried foods.  · Cook foods using methods other than frying.  · Avoid drinking alcohol.  · Avoid drinking large amounts of liquids with your meals.  · Avoid bending over or lying down until 2-3 hours after eating.  WHAT FOODS ARE NOT RECOMMENDED?  The following are some foods and drinks that may worsen your symptoms:  Vegetables  Tomatoes. Tomato juice. Tomato and spaghetti sauce. Chili peppers. Onion and garlic. Horseradish.  Fruits  Oranges, grapefruit, and lemon (fruit and juice).  Meats  High-fat meats, fish, and poultry. This includes hot dogs, ribs, ham, sausage, salami, and bacon.  Dairy  Whole milk and chocolate milk. Sour cream. Cream. Butter. Ice cream. Cream cheese.   Beverages  Coffee and tea, with or without caffeine. Carbonated beverages or energy drinks.  Condiments  Hot sauce. Barbecue sauce.   Sweets/Desserts  Chocolate and cocoa. Donuts. Peppermint and spearmint.  Fats and Oils  High-fat foods, including French fries and potato chips.  Other  Vinegar. Strong spices, such as black pepper, white pepper, red pepper, cayenne, curry powder, cloves, ginger, and chili powder.  The items listed above may not be a complete list of foods and beverages to avoid. Contact your dietitian for more information.  Document Released: 03/30/2005 Document Revised: 04/04/2013 Document Reviewed: 02/01/2013  ExitCare® Patient Information ©2015 [employer]. This information is not intended to replace advice given to you by your health care provider. Make sure you discuss any questions you have with your  health care provider.

## 2013-11-03 NOTE — ED Notes (Signed)
Per EMS - pt was at work, started to feel nauseous and central cp that radiates into upper abd. Pt reports she vomited x 1 at work. ems administered 2 nitro, pain went from 8/10 to 3/10. 345 mg of ASA. And 4 mg of zofran. 12 lead unremarkable 22 G in left hand. Denies cardiac hx. initially BP 180/100 last BP was 110/70. HR 76 NSR. RR 18. Lung sounds clear. Nausea has subsided.

## 2013-11-29 ENCOUNTER — Emergency Department (HOSPITAL_COMMUNITY): Payer: Managed Care, Other (non HMO)

## 2013-11-29 ENCOUNTER — Emergency Department (HOSPITAL_COMMUNITY)
Admission: EM | Admit: 2013-11-29 | Discharge: 2013-11-29 | Disposition: A | Discharge status: Home / Self Care | Payer: Managed Care, Other (non HMO) | Attending: Emergency Medicine | Admitting: Emergency Medicine

## 2013-11-29 ENCOUNTER — Encounter (HOSPITAL_COMMUNITY): Payer: Self-pay | Admitting: Emergency Medicine

## 2013-11-29 DIAGNOSIS — S62319A Displaced fracture of base of unspecified metacarpal bone, initial encounter for closed fracture: Secondary | ICD-10-CM | POA: Insufficient documentation

## 2013-11-29 DIAGNOSIS — W108XXA Fall (on) (from) other stairs and steps, initial encounter: Secondary | ICD-10-CM | POA: Insufficient documentation

## 2013-11-29 DIAGNOSIS — J45909 Unspecified asthma, uncomplicated: Secondary | ICD-10-CM | POA: Insufficient documentation

## 2013-11-29 DIAGNOSIS — S62339A Displaced fracture of neck of unspecified metacarpal bone, initial encounter for closed fracture: Secondary | ICD-10-CM

## 2013-11-29 DIAGNOSIS — Z3202 Encounter for pregnancy test, result negative: Secondary | ICD-10-CM | POA: Insufficient documentation

## 2013-11-29 DIAGNOSIS — S6990XA Unspecified injury of unspecified wrist, hand and finger(s), initial encounter: Secondary | ICD-10-CM | POA: Insufficient documentation

## 2013-11-29 DIAGNOSIS — Y939 Activity, unspecified: Secondary | ICD-10-CM | POA: Insufficient documentation

## 2013-11-29 DIAGNOSIS — Z791 Long term (current) use of non-steroidal anti-inflammatories (NSAID): Secondary | ICD-10-CM | POA: Insufficient documentation

## 2013-11-29 DIAGNOSIS — Y92009 Unspecified place in unspecified non-institutional (private) residence as the place of occurrence of the external cause: Secondary | ICD-10-CM | POA: Insufficient documentation

## 2013-11-29 DIAGNOSIS — Z79899 Other long term (current) drug therapy: Secondary | ICD-10-CM | POA: Insufficient documentation

## 2013-11-29 DIAGNOSIS — Z872 Personal history of diseases of the skin and subcutaneous tissue: Secondary | ICD-10-CM | POA: Insufficient documentation

## 2013-11-29 DIAGNOSIS — F17200 Nicotine dependence, unspecified, uncomplicated: Secondary | ICD-10-CM | POA: Insufficient documentation

## 2013-11-29 LAB — POC URINE PREG, ED: Preg Test, Ur: NEGATIVE

## 2013-11-29 MED ORDER — OXYCODONE-ACETAMINOPHEN 5-325 MG PO TABS
1.0000 | ORAL_TABLET | Freq: Once | ORAL | Status: AC
Start: 2013-11-29 — End: 2013-11-29
  Administered 2013-11-29: 1 via ORAL
  Filled 2013-11-29: qty 1

## 2013-11-29 MED ORDER — OXYCODONE-ACETAMINOPHEN 5-325 MG PO TABS: 1.0000 | ORAL_TABLET | Freq: Four times a day (QID) | ORAL | Status: DC | PRN

## 2013-11-29 MED ORDER — IBUPROFEN 800 MG PO TABS: 800.0000 mg | ORAL_TABLET | Freq: Three times a day (TID) | ORAL | Status: DC

## 2013-11-29 NOTE — ED Notes (Signed)
PA at bedside.
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
## 2013-11-29 NOTE — ED Notes (Signed)
Ortho called and will be here in 10 minutes
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
## 2013-11-29 NOTE — Discharge Instructions (Signed)
Boxer's Fracture °Boxer's fracture is a broken bone (fracture) of the fourth or fifth metacarpal (ring or pinky finger). The metacarpal bones connect the wrist to the fingers and make up the arch of the hand. Boxer's fracture occurs toward the body (proximal) from the first knuckle. This injury is known as a boxer's fracture, because it often occurs from hitting an object with a closed fist. °SYMPTOMS  °· Severe pain at the time of injury. °· Pain and swelling around the first knuckle on the fourth or fifth finger. °· Bruising (contusion) in the area within 48 hours of injury. °· Visible deformity, such as a pushed down knuckle. This can occur if the fracture is complete, and the bone fragments separate enough to distort normal body shape. °· Numbness or paralysis from swelling in the hand, causing pressure on the blood vessels or nerves (uncommon). °CAUSES  °· Direct injury (trauma), such as a striking blow with the fist. °· Indirect stress to the hand, such as twisting or violent muscle contraction (uncommon). °RISK INCREASES WITH: °· Punching an object with an unprotected knuckle. °· Contact sports (football, rugby). °· Sports that require hitting (boxing, martial arts). °· History of bone or joint disease (osteoporosis). °PREVENTION °· Maintain physical fitness: °¨ Muscle strength and flexibility. °¨ Endurance. °¨ Cardiovascular fitness. °· For participation in contact sports, wear proper protective equipment for the hand and make sure it fits properly. °· Learn and use proper technique when hitting or punching. °PROGNOSIS  °When proper treatment is given, to ensure normal alignment of the bones, healing can usually be expected in 4 to 6 weeks. Occasionally, surgery is necessary.  °RELATED COMPLICATIONS  °· Bone does not heal back together (nonunion). °· Bone heals together in an improper position (malunion), causing twisting of the finger when making a fist. °· Chronic pain, stiffness, or swelling of the  hand. °· Excessive bleeding in the hand, causing pressure and injury to nerves and blood vessels (rare). °· Stopping of normal hand growth in children. °· Infection of the wound, if skin is broken over the fracture (open fracture), or at the incision site if surgery is performed. °· Shortening of injured bones. °· Bony bump (prominence) in the palm or loss of shape of the knuckles. °· Pain and weakness when gripping. °· Arthritis of the affected joint, if the fracture goes into the joint, after repeated injury, or after delayed treatment. °· Scarring around the knuckle, and limited motion. °TREATMENT  °Treatment varies, depending on the injury. The place in the hand where the injury occurs has a great deal of motion, which allows the hand to move properly. If the fracture is not aligned properly, this function may be decreased. If the bone ends are in proper alignment, treatment first involves ice and elevation of the injured hand, at or above heart level, to reduce inflammation. Pain medicines help to relieve pain. Treatment also involves restraint by splinting, bandaging, casting, or bracing for 4 or more weeks.  °If the fracture is out of alignment (displaced), or it involves the joint, surgery is usually recommended. Surgery typically involves cutting through the skin to place removable pins, screws, and sometimes plates over the fracture. After surgery, the bone and joint are restrained for 4 or more weeks. After restraint (with or without surgery), stretching and strengthening exercises are needed to regain proper strength and function of the joint. Exercises may be done at home or with the assistance of a therapist. Depending on the sport and position played, a   brace or splint may be recommended when first returning to sports.  °MEDICATION  °· If pain medication is necessary, nonsteroidal anti-inflammatory medications, such as aspirin and ibuprofen, or other minor pain relievers, such as acetaminophen, are  often recommended. °· Do not take pain medication for 7 days before surgery. °· Prescription pain relievers may be necessary. Use only as directed and only as much as you need. °COLD THERAPY °Cold treatment (icing) relieves pain and reduces inflammation. Cold treatment should be applied for 10 to 15 minutes every 2 to 3 hours for inflammation and pain, and immediately after any activity that aggravates your symptoms. Use ice packs or an ice massage. °SEEK MEDICAL CARE IF:  °· Pain, tenderness, or swelling gets worse, despite treatment. °· You experience pain, numbness, or coldness in the hand. °· Blue, gray, or dark color appears in the fingernails. °· You develop signs of infection, after surgery (fever, increased pain, swelling, redness, drainage of fluids, or bleeding in the surgical area). °· You feel you have reinjured the hand. °· New, unexplained symptoms develop. (Drugs used in treatment may produce side effects.) °Document Released: 03/30/2005 Document Revised: 06/22/2011 Document Reviewed: 07/12/2008 °ExitCare® Patient Information ©2015 [employer]. This information is not intended to replace advice given to you by your health care provider. Make sure you discuss any questions you have with your health care provider. ° °

## 2013-11-30 NOTE — ED Provider Notes (Signed)
  Medical screening examination/treatment/procedure(s) were performed by non-physician practitioner and as supervising physician I was immediately available for consultation/collaboration.   EKG Interpretation None         Terron Merfeld, MD 11/30/13 0716 

## 2014-03-22 ENCOUNTER — Encounter (HOSPITAL_COMMUNITY): Payer: Self-pay | Admitting: *Deleted

## 2014-03-22 ENCOUNTER — Emergency Department (HOSPITAL_COMMUNITY)
Admission: EM | Admit: 2014-03-22 | Discharge: 2014-03-22 | Disposition: A | Discharge status: Home / Self Care | Payer: Managed Care, Other (non HMO) | Attending: Emergency Medicine | Admitting: Emergency Medicine

## 2014-03-22 DIAGNOSIS — Z791 Long term (current) use of non-steroidal anti-inflammatories (NSAID): Secondary | ICD-10-CM | POA: Insufficient documentation

## 2014-03-22 DIAGNOSIS — J45909 Unspecified asthma, uncomplicated: Secondary | ICD-10-CM | POA: Insufficient documentation

## 2014-03-22 DIAGNOSIS — Z872 Personal history of diseases of the skin and subcutaneous tissue: Secondary | ICD-10-CM | POA: Insufficient documentation

## 2014-03-22 DIAGNOSIS — Z720 Tobacco use: Secondary | ICD-10-CM | POA: Insufficient documentation

## 2014-03-22 DIAGNOSIS — L02612 Cutaneous abscess of left foot: Secondary | ICD-10-CM

## 2014-03-22 MED ORDER — CEPHALEXIN 500 MG PO CAPS: 500.0000 mg | ORAL_CAPSULE | Freq: Four times a day (QID) | ORAL | Status: DC

## 2014-03-22 MED ORDER — SULFAMETHOXAZOLE-TRIMETHOPRIM 800-160 MG PO TABS: 1.0000 | ORAL_TABLET | Freq: Two times a day (BID) | ORAL | Status: AC

## 2014-03-22 MED ORDER — LIDOCAINE HCL 1 % IJ SOLN
10.0000 mg | Freq: Once | INTRAMUSCULAR | Status: AC
  Administered 2014-03-22: 10 mg
  Filled 2014-03-22: qty 20

## 2014-03-22 MED ORDER — LORAZEPAM 1 MG PO TABS
1.0000 mg | ORAL_TABLET | Freq: Once | ORAL | Status: AC
  Administered 2014-03-22: 1 mg via ORAL
  Filled 2014-03-22: qty 1

## 2014-03-22 NOTE — ED Provider Notes (Signed)
CSN: [id number]     Arrival date & time 03/22/14  2047 History  This chart was scribed for non-physician practitioner working with Mirian MoMatthew Gentry, MD by Richarda Overlieichard Holland, ED Scribe. This patient was seen in room WTR5/WTR5 and the patient's care was started at 10:03 PM.    Chief Complaint  Patient presents with  . Foot Pain   HPI HPI Comments: Cathy Hill is a 33 y.o. female who presents to the Emergency Department complaining of moderate left foot pain for the last 6 days. Pt states Cathy Hill was putting on her shoe and Cathy Hill felt a prick on her foot in between her left 4th and 5th toes 6 days ago. Cathy Hill states Cathy Hill suspected it was an insect bite. Pt says the area went numb the other day. Cathy Hill states that weight bearing on her left foot aggravates her pain. Pt says the pain radiates to the rest of the foot when Cathy Hill walks and states Cathy Hill has been walking with a limp. Cathy Hill reports no alleviating factors at this time.    Past Medical History  Diagnosis Date  . Asthma   . Eczema    Past Surgical History  Procedure Laterality Date  . Knee surgery    . Cesarean section     No family history on file. History  Substance Use Topics  . Smoking status: Current Every Day Smoker -- 0.50 packs/day    Types: Cigarettes  . Smokeless tobacco: Never Used  . Alcohol Use: Yes     Comment: occasionally every Saturday, vodka 3 drinks   OB History    No data available      Review of Systems  Musculoskeletal: Positive for myalgias.  Skin: Positive for wound.  All other systems reviewed and are negative.   Allergies  Brompheniramine  Home Medications   Prior to Admission medications   Medication Sig Start Date End Date Taking? Authorizing Provider  acetaminophen (TYLENOL) 500 MG tablet Take 1,000 mg by mouth every 6 (six) hours as needed for mild pain or headache (headache).    Yes Historical Provider, MD  ibuprofen (ADVIL,MOTRIN) 800 MG tablet Take 1 tablet (800 mg total) by mouth 3 (three) times  daily. 11/29/13  Yes Monte FantasiaJoseph W Mintz, PA-C  cephALEXin (KEFLEX) 500 MG capsule Take 1 capsule (500 mg total) by mouth 4 (four) times daily. 03/22/14   Antony MaduraKelly Carlee Tesfaye, PA-C  omeprazole (PRILOSEC) 40 MG capsule Take 1 capsule (40 mg total) by mouth daily. Patient not taking: Reported on 03/22/2014 11/03/13   Layla MawKristen N Ward, DO  oxyCODONE-acetaminophen (PERCOCET) 5-325 MG per tablet Take 1-2 tablets by mouth every 6 (six) hours as needed. Patient not taking: Reported on 03/22/2014 11/29/13   Monte FantasiaJoseph W Mintz, PA-C  PRESCRIPTION MEDICATION Apply 1 application topically daily as needed (eczema).    Historical Provider, MD  sulfamethoxazole-trimethoprim (BACTRIM DS,SEPTRA DS) 800-160 MG per tablet Take 1 tablet by mouth 2 (two) times daily. 03/22/14 03/29/14  Antony MaduraKelly Grantland Want, PA-C   BP 120/82 mmHg  Pulse 84  Temp(Src) 98 F (36.7 C) (Oral)  Resp 16  SpO2 100%  LMP 03/15/2014   Physical Exam  Constitutional: Cathy Hill is oriented to person, place, and time. Cathy Hill appears well-developed and well-nourished. No distress.  HENT:  Head: Normocephalic and atraumatic.  Eyes: Conjunctivae and EOM are normal. No scleral icterus.  Neck: Normal range of motion.  Cardiovascular: Normal rate, regular rhythm and intact distal pulses.   DP and PT pulses 2+ in LLE  Pulmonary/Chest: Effort normal. No  respiratory distress.  Musculoskeletal: Normal range of motion. Cathy Hill exhibits tenderness.  Normal ROM of L foot. Abscess noted to interphalangeal space of the fourth and fifth digits. No active drainage or weeping. No surrounding erythema or heat to touch. No induration.  Neurological: Cathy Hill is alert and oriented to person, place, and time. Cathy Hill exhibits normal muscle tone. Coordination normal.  Sensation to light touch intact. Patient able to wiggle all toes of L foot.  Skin: Skin is warm and dry. No rash noted. Cathy Hill is not diaphoretic. No erythema. No pallor.  Psychiatric: Cathy Hill has a normal mood and affect. Her behavior is normal.   Nursing note and vitals reviewed.   ED Course  Procedures   DIAGNOSTIC STUDIES: Oxygen Saturation is 100% on RA, normal by my interpretation.    COORDINATION OF CARE: 10:14 PM Discussed treatment plan with pt at bedside and pt agreed to plan.  Labs Review Labs Reviewed - No data to display  Imaging Review No results found.   EKG Interpretation None     INCISION AND DRAINAGE Performed by: Antony MaduraHUMES, Ruwayda Curet Consent: Verbal consent obtained. Risks and benefits: risks, benefits and alternatives were discussed Type: abscess  Body area: interphalangeal space 4th/5th digits  Anesthesia: local infiltration  Incision was made with a scalpel.  Local anesthetic: lidocaine 1% without epinephrine  Anesthetic total: 1.5 ml  Complexity: complex Blunt dissection to break up loculations  Drainage: purulent  Drainage amount: small  Packing material: none  Patient tolerance: Patient tolerated the procedure well with no immediate complications.    MDM   Final diagnoses:  Foot abscess, left    Patient with skin abscess amenable to incision and drainage.  Abscess was not large enough to warrant packing or drain, wound recheck in 2 days advised with PCP. Encouraged home warm soaks and flushing. Mild signs of cellulitis is surrounding skin. Given location, will start abx to prevent abscess tracking into deep spaces of foot. Return precautions provided and patient agreeable to plan with no unaddressed concerns.  I personally performed the services described in this documentation, which was scribed in my presence. The recorded information has been reviewed and is accurate.   Filed Vitals:   03/22/14 2110 03/22/14 2305  BP: 129/94 120/82  Pulse: 102 84  Temp: 98 F (36.7 C)   TempSrc: Oral   Resp: 18 16  SpO2: 100% 100%        Antony MaduraKelly Ramadan Couey, PA-C 03/22/14 2355  Mirian MoMatthew Gentry, MD 03/26/14 [phone]

## 2014-03-22 NOTE — ED Notes (Signed)
Pt reports feeling a sharp sting in between her L 5th and 4th toe when she put her shoes on last night.  Now c/o pain.  No swelling or redness noted at this time.

## 2014-03-22 NOTE — Discharge Instructions (Signed)
Take Bactrim and Keflex as prescribed. Take ibuprofen for pain control as needed. Follow up with your primary care doctor for a recheck. Return if symptoms worsen.  Abscess An abscess is an infected area that contains a collection of pus and debris.It can occur in almost any part of the body. An abscess is also known as a furuncle or boil. CAUSES  An abscess occurs when tissue gets infected. This can occur from blockage of oil or sweat glands, infection of hair follicles, or a minor injury to the skin. As the body tries to fight the infection, pus collects in the area and creates pressure under the skin. This pressure causes pain. People with weakened immune systems have difficulty fighting infections and get certain abscesses more often.  SYMPTOMS Usually an abscess develops on the skin and becomes a painful mass that is red, warm, and tender. If the abscess forms under the skin, you may feel a moveable soft area under the skin. Some abscesses break open (rupture) on their own, but most will continue to get worse without care. The infection can spread deeper into the body and eventually into the bloodstream, causing you to feel ill.  DIAGNOSIS  Your caregiver will take your medical history and perform a physical exam. A sample of fluid may also be taken from the abscess to determine what is causing your infection. TREATMENT  Your caregiver may prescribe antibiotic medicines to fight the infection. However, taking antibiotics alone usually does not cure an abscess. Your caregiver may need to make a small cut (incision) in the abscess to drain the pus. In some cases, gauze is packed into the abscess to reduce pain and to continue draining the area. HOME CARE INSTRUCTIONS   Only take over-the-counter or prescription medicines for pain, discomfort, or fever as directed by your caregiver.  If you were prescribed antibiotics, take them as directed. Finish them even if you start to feel better.  If  gauze is used, follow your caregiver's directions for changing the gauze.  To avoid spreading the infection:  Keep your draining abscess covered with a bandage.  Wash your hands well.  Do not share personal care items, towels, or whirlpools with others.  Avoid skin contact with others.  Keep your skin and clothes clean around the abscess.  Keep all follow-up appointments as directed by your caregiver. SEEK MEDICAL CARE IF:   You have increased pain, swelling, redness, fluid drainage, or bleeding.  You have muscle aches, chills, or a general ill feeling.  You have a fever. MAKE SURE YOU:   Understand these instructions.  Will watch your condition.  Will get help right away if you are not doing well or get worse. Document Released: 01/07/2005 Document Revised: 09/29/2011 Document Reviewed: 06/12/2011 [facility]Exit[facility] Patient Information 2015 [place], [place]. This information is not intended to replace advice given to you by your health care provider. Make sure you discuss any questions you have with your health care provider.  Abscess Care After An abscess (also called a boil or furuncle) is an infected area that contains a collection of pus. Signs and symptoms of an abscess include pain, tenderness, redness, or hardness, or you may feel a moveable soft area under your skin. An abscess can occur anywhere in the body. The infection may spread to surrounding tissues causing cellulitis. A cut (incision) by the surgeon was made over your abscess and the pus was drained out. Gauze may have been packed into the space to provide a drain that  will allow the cavity to heal from the inside outwards. The boil may be painful for 5 to 7 days. Most people with a boil do not have high fevers. Your abscess, if seen early, may not have localized, and may not have been lanced. If not, another appointment may be required for this if it does not get better on its own or with medications. HOME CARE INSTRUCTIONS     Only take over-the-counter or prescription medicines for pain, discomfort, or fever as directed by your caregiver.  When you bathe, soak and then remove gauze or iodoform packs at least daily or as directed by your caregiver. You may then wash the wound gently with mild soapy water. Repack with gauze or do as your caregiver directs. SEEK IMMEDIATE MEDICAL CARE IF:   You develop increased pain, swelling, redness, drainage, or bleeding in the wound site.  You develop signs of generalized infection including muscle aches, chills, fever, or a general ill feeling.  An oral temperature above 102 F (38.9 C) develops, not controlled by medication. See your caregiver for a recheck if you develop any of the symptoms described above. If medications (antibiotics) were prescribed, take them as directed. Document Released: 10/16/2004 Document Revised: 06/22/2011 Document Reviewed: 06/13/2007 [facility]imaExitCare Patient Information 2015 [place]Care, [place]LL[place]. This information is not intended to replace advice given to you by your health care provider. Make sure you discuss any questions you have with your health care provider.

## 2014-09-24 ENCOUNTER — Emergency Department (HOSPITAL_COMMUNITY): Payer: No Typology Code available for payment source

## 2014-09-24 ENCOUNTER — Encounter (HOSPITAL_COMMUNITY): Payer: Self-pay

## 2014-09-24 ENCOUNTER — Emergency Department (HOSPITAL_COMMUNITY)
Admission: EM | Admit: 2014-09-24 | Discharge: 2014-09-24 | Disposition: A | Discharge status: Home / Self Care | Payer: No Typology Code available for payment source | Attending: Emergency Medicine | Admitting: Emergency Medicine

## 2014-09-24 DIAGNOSIS — J45909 Unspecified asthma, uncomplicated: Secondary | ICD-10-CM | POA: Insufficient documentation

## 2014-09-24 DIAGNOSIS — Z720 Tobacco use: Secondary | ICD-10-CM | POA: Diagnosis not present

## 2014-09-24 DIAGNOSIS — S3992XA Unspecified injury of lower back, initial encounter: Secondary | ICD-10-CM | POA: Insufficient documentation

## 2014-09-24 DIAGNOSIS — Y92410 Unspecified street and highway as the place of occurrence of the external cause: Secondary | ICD-10-CM | POA: Insufficient documentation

## 2014-09-24 DIAGNOSIS — M25511 Pain in right shoulder: Secondary | ICD-10-CM

## 2014-09-24 DIAGNOSIS — S199XXA Unspecified injury of neck, initial encounter: Secondary | ICD-10-CM | POA: Diagnosis not present

## 2014-09-24 DIAGNOSIS — Y9389 Activity, other specified: Secondary | ICD-10-CM | POA: Insufficient documentation

## 2014-09-24 DIAGNOSIS — S4991XA Unspecified injury of right shoulder and upper arm, initial encounter: Secondary | ICD-10-CM | POA: Diagnosis present

## 2014-09-24 DIAGNOSIS — M545 Low back pain, unspecified: Secondary | ICD-10-CM

## 2014-09-24 DIAGNOSIS — Y998 Other external cause status: Secondary | ICD-10-CM | POA: Insufficient documentation

## 2014-09-24 DIAGNOSIS — Z872 Personal history of diseases of the skin and subcutaneous tissue: Secondary | ICD-10-CM | POA: Diagnosis not present

## 2014-09-24 MED ORDER — NAPROXEN 500 MG PO TABS: 500.0000 mg | ORAL_TABLET | Freq: Two times a day (BID) | ORAL | Status: DC

## 2014-09-24 MED ORDER — HYDROCODONE-ACETAMINOPHEN 5-325 MG PO TABS
2.0000 | ORAL_TABLET | ORAL | Status: DC | PRN
Start: 2014-09-24 — End: 2016-07-29

## 2014-09-24 MED ORDER — METHOCARBAMOL 500 MG PO TABS: 500.0000 mg | ORAL_TABLET | Freq: Two times a day (BID) | ORAL | Status: DC

## 2014-09-24 NOTE — ED Notes (Signed)
Pt refused blood work, in/out cath to test for pregnancy prior to CT scan.  Risks of CT scan explained to pt who states understanding.  Cala [place], [place] notified.
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
## 2014-09-24 NOTE — ED Notes (Signed)
Pt stable, ambulatory, states understanding of discharge instructions 
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
## 2014-10-31 ENCOUNTER — Ambulatory Visit: Payer: No Typology Code available for payment source | Attending: Orthopedic Surgery | Admitting: Physical Therapy

## 2014-10-31 DIAGNOSIS — M25511 Pain in right shoulder: Secondary | ICD-10-CM | POA: Insufficient documentation

## 2014-10-31 DIAGNOSIS — M791 Myalgia: Secondary | ICD-10-CM | POA: Insufficient documentation

## 2014-11-05 ENCOUNTER — Encounter: Payer: Self-pay | Admitting: Physical Therapy

## 2014-11-05 ENCOUNTER — Ambulatory Visit: Payer: No Typology Code available for payment source | Admitting: Physical Therapy

## 2014-11-05 DIAGNOSIS — M791 Myalgia: Secondary | ICD-10-CM | POA: Diagnosis present

## 2014-11-05 DIAGNOSIS — M25511 Pain in right shoulder: Secondary | ICD-10-CM | POA: Diagnosis present

## 2014-11-05 DIAGNOSIS — M7918 Myalgia, other site: Secondary | ICD-10-CM
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
## 2014-11-05 NOTE — Addendum Note (Signed)
Addended by: Karie Mainland L on: 11/05/2014 03:36 PM   Modules accepted: Orders

## 2014-11-06 ENCOUNTER — Ambulatory Visit: Payer: No Typology Code available for payment source | Admitting: Physical Therapy

## 2014-11-16 ENCOUNTER — Ambulatory Visit
Payer: No Typology Code available for payment source | Attending: Orthopedic Surgery | Admitting: Rehabilitative and Restorative Service Providers"

## 2014-11-16 DIAGNOSIS — M25511 Pain in right shoulder: Secondary | ICD-10-CM | POA: Insufficient documentation

## 2014-11-16 DIAGNOSIS — M7918 Myalgia, other site: Secondary | ICD-10-CM

## 2014-11-16 DIAGNOSIS — M791 Myalgia: Secondary | ICD-10-CM | POA: Insufficient documentation

## 2014-11-16 NOTE — Patient Instructions (Signed)
Discussed decreasing cervical tension and allowing R UE to hang due to shoulder shrug due to tension; discussed work posture with handout issued for workstation setup. Discussed anatomy during Ultrasound. HEP issued: Levator, Upper Trap, [place] stretch 2-3x/day, 3 reps, 30 sec holds. Advised pt she may be sore from tx later and she must perform her HEP and use heat as needed.
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
## 2014-11-19 ENCOUNTER — Ambulatory Visit: Payer: No Typology Code available for payment source

## 2014-11-21 ENCOUNTER — Ambulatory Visit: Payer: No Typology Code available for payment source

## 2014-11-27 ENCOUNTER — Ambulatory Visit: Payer: No Typology Code available for payment source

## 2014-11-27 DIAGNOSIS — M791 Myalgia: Secondary | ICD-10-CM | POA: Diagnosis not present

## 2014-11-27 DIAGNOSIS — M7918 Myalgia, other site: Secondary | ICD-10-CM

## 2014-11-27 DIAGNOSIS — M25511 Pain in right shoulder: Secondary | ICD-10-CM

## 2014-11-27 NOTE — Patient Instructions (Signed)
Asked patient to do normal side bend and rotation but to relax and move full range even with pain x2 2-3x/day no stretch time

## 2014-11-27 NOTE — Therapy (Addendum)
[facility] Outpatient Rehabilitation [facility] [address] [place], [place], [address] Phone: [phone]   Fax:  [phone]  Physical Therapy Treatment  Patient Details  Name: Cathy Hill MRN: [id number] Date of Birth: [birth year] Referring Provider:  Terressa Koyanagi, DO  Encounter Date: 11/27/2014      PT End of Session - 11/27/14 0746    Visit Number 3   Number of Visits 16   Date for PT Re-Evaluation 12/31/14   PT Start Time 0710   PT Stop Time 0800   PT Time Calculation (min) 50 min   Activity Tolerance Patient tolerated treatment well;Patient limited by pain   Behavior During Therapy [facility] for tasks assessed/performed      Past Medical History  Diagnosis Date  . Asthma   . Eczema     Past Surgical History  Procedure Laterality Date  . Knee surgery    . Cesarean section      There were no vitals filed for this visit.  Visit Diagnosis:  Myofascial pain syndrome, cervical  Pain in shoulder region, right      Subjective Assessment - 11/27/14 0709    Subjective She reports soreness in back and is doing HEP and they are helping. Doing 3x/day . 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 Yes   Pain Score 3   with medication   Pain Location Shoulder   Pain Orientation Right;Posterior   Pain Descriptors / Indicators Aching   Pain Type Chronic pain   Pain Onset More than a month ago   Pain Frequency Constant   Aggravating Factors  working   Pain Relieving Factors medicine   Multiple Pain Sites No            [facility] PT Assessment - 11/27/14 0711    AROM   Right Shoulder Flexion 145 Degrees   Right Shoulder ABduction 152 Degrees   Right Shoulder External Rotation 90 Degrees   Cervical Flexion 17   Cervical Extension 35   Cervical - Right Side Bend 25   Cervical - Left Side Bend 25   Cervical - Left Rotation 35  RT rotation 43 degrees                     [facility] Adult PT Treatment/Exercise -  11/27/14 0726    Moist Heat Therapy   Number Minutes Moist Heat 12 Minutes   Moist Heat Location Cervical  and top pf RT shoulder    Manual Therapy   Soft tissue mobilization STW manually to trasp scalenes, levator rhomboids.    Myofascial Release 10 sec x 3 to trap   Passive ROM neck rotation and sidebending and cued for relaxation and how to do this with chin about 4 fingers from chin to relax and to go as far as able being relaxecd     Reviewed HEP stretching and she was able to do these           PT Education - 11/27/14 0745    Education provided Yes   Education Details Need to relax and move with smooyhness and go as far as can even with some pain. Medicaiton as needed Support RT arm on pillow with sitting   Person(s) Educated Patient   Methods Explanation;Tactile cues;Verbal cues   Comprehension Returned demonstration;Verbalized understanding          PT Short Term Goals - 11/27/14 0749    PT SHORT TERM GOAL #  1   Title I with initial HEP   Status Achieved   PT SHORT TERM GOAL #2   Title Pt will demo an increase in active R shoulder flexion, abduction, and IR to full range in order to  perform ADLs    Baseline increased but still not full range   Status On-going   PT SHORT TERM GOAL #3   Title pt reports tolerance to palpation in sitting and supine in order to perform deep myofascial release techniques   Baseline She tolerates moderate pressur eto STW   Status Achieved   PT SHORT TERM GOAL #4   Title Pt will demo normalized gait pattern indicating decrease in muscle guarding in right UE    Status On-going           PT Long Term Goals - 11/16/14 0753    PT LONG TERM GOAL #1   Title Pt will be I with HEP   Time 8   Period Weeks   Status On-going   PT LONG TERM GOAL #2   Title Pt will demo full ROM in cervical motions in order to drive safely I   Baseline significant guarding and stiffness, decreased range in all motions   Time 8   Period Weeks    Status On-going   PT LONG TERM GOAL #3   Title Pt will demo improved ability to tolerate UE MMT resistance without pain   Time 8   Period Weeks   Status On-going   PT LONG TERM GOAL #4   Time 8   Period Weeks   Status New               Plan - 11/27/14 0747    Clinical Impression Statement Range improved with less pain . She has full motion passive /aci=tive assit when relaxed. Pain incr with this . She is progressing though tolerance to activity is still decreased with incr pain post work.    PT Next Visit Plan Continue modalities , stretching , Manual treatments, tape?, work around scapula and levator more next visit   Consulted and Agree with Plan of Care Patient        Problem List There are no active problems to display for this patient.   Caprice Red PT 11/27/2014, 8:01 AM  [facility] [address] [place], [place], [address] Phone: [phone]   Fax:  [phone]

## 2014-11-29 ENCOUNTER — Ambulatory Visit: Payer: No Typology Code available for payment source

## 2014-12-03 ENCOUNTER — Ambulatory Visit: Payer: No Typology Code available for payment source

## 2014-12-05 ENCOUNTER — Ambulatory Visit: Payer: No Typology Code available for payment source

## 2014-12-05 DIAGNOSIS — M7918 Myalgia, other site: Secondary | ICD-10-CM

## 2014-12-05 DIAGNOSIS — M25511 Pain in right shoulder: Secondary | ICD-10-CM

## 2014-12-05 DIAGNOSIS — M791 Myalgia: Secondary | ICD-10-CM | POA: Diagnosis not present

## 2014-12-05 NOTE — Therapy (Signed)
[facility] Outpatient Rehabilitation [facility] [address] [place], [place], [address] Phone: [phone]   Fax:  [phone]  Physical Therapy Treatment  Patient Details  Name: Cathy Hill MRN: [id number] Date of Birth: 08/14/1980 Referring Provider:  Terressa Koyanagi, DO  Encounter Date: 12/05/2014      PT End of Session - 12/05/14 0747    Visit Number 4   Number of Visits 16   Date for PT Re-Evaluation 12/31/14   PT Start Time 0700   PT Stop Time 0738   PT Time Calculation (min) 38 min   Activity Tolerance Patient tolerated treatment well;Patient limited by pain   Behavior During Therapy [facility] for tasks assessed/performed      Past Medical History  Diagnosis Date  . Asthma   . Eczema     Past Surgical History  Procedure Laterality Date  . Knee surgery    . Cesarean section      There were no vitals filed for this visit.  Visit Diagnosis:  Myofascial pain syndrome, cervical  Pain in shoulder region, right      Subjective Assessment - 12/05/14 0701    Subjective What did you do last time it was wonderful, great.   Currently in Pain? No/denies            [facility] PT Assessment - 12/05/14 0001    AROM   Cervical Flexion 28   Cervical Extension 50   Cervical - Right Side Bend 40   Cervical - Left Rotation 62  RT rotation 60 degrees                     [facility] Adult PT Treatment/Exercise - 12/05/14 0740    Neuro Re-ed    Neuro Re-ed Details  We worked on relaxation with movemnts and stretching. Tactile and verbal cues   Exercises   Exercises Neck   Neck Exercises: Machines for Strengthening   UBE (Upper Arm Bike) L1 5 min   Ultrasound   Ultrasound Location 1.5Wcm2,   Ultrasound Parameters neck   Ultrasound Goals Pain   Manual Therapy   Soft tissue mobilization STW manually to traps scalenes, levator ,rhomboids. With black rock blade and hands   Passive ROM Neck all planes     Active shoulder and neck range all  planes 1-2 reps           PT Education - 12/05/14 0747    Education provided Yes   Education Details Worked on Health and safety inspector) Educated Patient   Methods Explanation;Tactile cues;Verbal cues;Demonstration   Comprehension Verbalized understanding;Returned demonstration          PT Short Term Goals - 12/05/14 0749    PT SHORT TERM GOAL #1   Title I with initial HEP   Status Achieved   PT SHORT TERM GOAL #2   Title Pt will demo an increase in active R shoulder flexion, abduction, and IR to full range in order to  perform ADLs    Status Achieved   PT SHORT TERM GOAL #3   Title pt reports tolerance to palpation in sitting and supine in order to perform deep myofascial release techniques   Status Achieved   PT SHORT TERM GOAL #4   Title Pt will demo normalized gait pattern indicating decrease in muscle guarding in right UE    Status Achieved           PT Long Term Goals - 11/16/14 0626  PT LONG TERM GOAL #1   Title Pt will be I with HEP   Time 8   Period Weeks   Status On-going   PT LONG TERM GOAL #2   Title Pt will demo full ROM in cervical motions in order to drive safely I   Baseline significant guarding and stiffness, decreased range in all motions   Time 8   Period Weeks   Status On-going   PT LONG TERM GOAL #3   Title Pt will demo improved ability to tolerate UE MMT resistance without pain   Time 8   Period Weeks   Status On-going   PT LONG TERM GOAL #4   Time 8   Period Weeks   Status New               Plan - 12/05/14 0747    Clinical Impression Statement Much improved with range and pain levels . She declined heat at end due to feeling well. May start strengthening exercises   PT Next Visit Plan Continue modalities , stretching , Manual treatments, possible band exercises   Consulted and Agree with Plan of Care Patient        Problem List There are no active problems to display for this patient.   Caprice Red PT 12/05/2014, 7:50 AM  [facility] [address] [place], [place], [address] Phone: [phone]   Fax:  [phone]

## 2014-12-14 ENCOUNTER — Ambulatory Visit: Payer: Managed Care, Other (non HMO) | Attending: Orthopedic Surgery

## 2014-12-14 DIAGNOSIS — M791 Myalgia: Secondary | ICD-10-CM | POA: Insufficient documentation

## 2014-12-14 DIAGNOSIS — M25511 Pain in right shoulder: Secondary | ICD-10-CM | POA: Insufficient documentation

## 2014-12-19 ENCOUNTER — Ambulatory Visit: Payer: Managed Care, Other (non HMO)

## 2014-12-19 DIAGNOSIS — M25511 Pain in right shoulder: Secondary | ICD-10-CM | POA: Diagnosis present

## 2014-12-19 DIAGNOSIS — M7918 Myalgia, other site: Secondary | ICD-10-CM

## 2014-12-19 DIAGNOSIS — M791 Myalgia: Secondary | ICD-10-CM | POA: Diagnosis present

## 2014-12-19 NOTE — Therapy (Signed)
[place], [place], [address] Phone: [phone]   Fax:  [phone]  Physical Therapy Treatment  Patient Details  Name: Cathy Hill MRN: [id number] Date of Birth: 07/03/1980 Referring Provider:  Lucretia Kern, DO  Encounter Date: 12/19/2014      PT End of Session - 12/19/14 0750    Visit Number 6   Number of Visits 12   Date for PT Re-Evaluation 12/31/14   PT Start Time 0710   PT Stop Time 0800   PT Time Calculation (min) 50 min   Activity Tolerance Patient tolerated treatment well   Behavior During Therapy [facility] for tasks assessed/performed      Past Medical History  Diagnosis Date  . Asthma   . Eczema     Past Surgical History  Procedure Laterality Date  . Knee surgery    . Cesarean section      There were no vitals filed for this visit.  Visit Diagnosis:  Myofascial pain syndrome, cervical  Pain in shoulder region, right      Subjective Assessment - 12/19/14 0757    Subjective NO pain since last time except last night thiought I slept wrong.  Couldn't carry my water the whole distance without stopping to rest. No pain   Currently in Pain? No/denies   Multiple Pain Sites No                         [facility] Adult PT Treatment/Exercise - 12/19/14 0744    Self-Care   Self-Care Posture   Posture i reviewed sleeping position with pillow and use of towel roll in pilllow case.  She demo on mat and reported understanding . I also sidcussed awareness of positions that cause pain and to avoid theses. and to expect sidcomfort with activity but to stop before pain starts/rest ansd resume as able   Neck Exercises: Machines for Strengthening   UBE (Upper Arm Bike) 120 RPm 5 min   Neck Exercises: Theraband   Shoulder Extension 10 reps;Red   Rows 10 reps;Red   Horizontal ABduction 10 reps;Red   Moist Heat Therapy   Number Minutes Moist Heat 18 Minutes   Moist Heat Location  Cervical  and upper back                PT Education - 12/19/14 0750    Education Details sleeping positions , HEP stretch and band exercise   Person(s) Educated Patient   Methods Explanation;Demonstration;Verbal cues;Tactile cues;Handout   Comprehension Returned demonstration;Verbalized understanding          PT Short Term Goals - 12/19/14 0753    PT SHORT TERM GOAL #1   Title I with initial HEP   Status Achieved   PT SHORT TERM GOAL #2   Title Pt will demo an increase in active R shoulder flexion, abduction, and IR to full range in order to  perform ADLs    Status Achieved   PT SHORT TERM GOAL #3   Title pt reports tolerance to palpation in sitting and supine in order to perform deep myofascial release techniques   Status Achieved   PT SHORT TERM GOAL #4   Title Pt will demo normalized gait pattern indicating decrease in muscle guarding in right UE    Status Achieved           PT Long Term Goals - 12/19/14 0753    PT LONG TERM GOAL #1  Title Pt will be I with HEP   Status On-going   PT LONG TERM GOAL #3   Title Pt will demo improved ability to tolerate UE MMT resistance without pain   Status Partially Met   PT LONG TERM GOAL #4   Title Pt will be able to perform all ADL's I without an increase in pain   Status On-going   PT LONG TERM GOAL #5   Title Pt will report improved ability to lift water buckets wihtout increased pain in order to tend to her garden I   Baseline had to stop part way to location   Status Partially Met               Plan - 12/19/14 0751    Clinical Impression Statement She is much improve pain wise but activity tolerance still limited. Will progress HEP as able. MHP as needed, Continue stretching. Continue 4-6 visits then discharge   PT Next Visit Plan Progressive strength , modalities, STW, measure range and strength and renew for 3 weeks after next visit   PT Home Exercise Plan band exer and stretching, pposture awareneess    Consulted and Agree with Plan of Care Patient        Problem List There are no active problems to display for this patient.   Darrel Hoover PT 12/19/2014, 7:58 AM  [facility] [address] [place], [place], [address] Phone: [phone]   Fax:  [phone]

## 2014-12-19 NOTE — Patient Instructions (Signed)
Issued red band and issued from cabinet attached band exercises and rhomboid stretch for posterior shoulder. Exercises 1x/day 5-1- reps and stretch PRN and post exercises 2-3 reps 15-30 sec

## 2014-12-21 ENCOUNTER — Ambulatory Visit: Payer: Managed Care, Other (non HMO)

## 2014-12-21 DIAGNOSIS — M25511 Pain in right shoulder: Secondary | ICD-10-CM

## 2014-12-21 DIAGNOSIS — M791 Myalgia: Secondary | ICD-10-CM | POA: Diagnosis not present

## 2014-12-21 DIAGNOSIS — M7918 Myalgia, other site: Secondary | ICD-10-CM

## 2014-12-21 NOTE — Therapy (Addendum)
**Note De-Identified Cathy Obfuscation** [place], [place], [address] Phone: [phone]   Fax:  [phone]  Physical Therapy Treatment  Patient Details  Name: Cathy Hill MRN: [id number] Date of Birth: [birth year] Referring Provider:  Lucretia Kern, DO  Encounter Date: 12/21/2014      PT End of Session - 12/21/14 0750    Visit Number 7   Number of Visits 12   Date for PT Re-Evaluation 12/31/14   PT Start Time 0715  late 15 min   PT Stop Time 0745   PT Time Calculation (min) 30 min   Activity Tolerance Patient tolerated treatment well   Behavior During Therapy [facility] for tasks assessed/performed      Past Medical History  Diagnosis Date  . Asthma   . Eczema     Past Surgical History  Procedure Laterality Date  . Knee surgery    . Cesarean section      There were no vitals filed for this visit.  Visit Diagnosis:  Myofascial pain syndrome, cervical  Pain in shoulder region, right      Subjective Assessment - 12/21/14 0715    Subjective I feel good. Have been oing stretches.                          [facility] Adult PT Treatment/Exercise - 12/21/14 0716    Neck Exercises: Machines for Strengthening   UBE (Upper Arm Bike) 120 RPm   3 min forward, 3 min back   Neck Exercises: Supine   Other Supine Exercise Chin tucks with cervical unattached scapula band exercises red band (green issued for latera) x 12 reps , Arm openings RT and LT for HEP done well after instruction and demo                  PT Short Term Goals - 12/19/14 0753    PT SHORT TERM GOAL #1   Title I with initial HEP   Status Achieved   PT SHORT TERM GOAL #2   Title Pt will demo an increase in active R shoulder flexion, abduction, and IR to full range in order to  perform ADLs    Status Achieved   PT SHORT TERM GOAL #3   Title pt reports tolerance to palpation in sitting and supine in order to perform deep myofascial release techniques    Status Achieved   PT SHORT TERM GOAL #4   Title Pt will demo normalized gait pattern indicating decrease in muscle guarding in right UE    Status Achieved           PT Long Term Goals - 12/19/14 0753    PT LONG TERM GOAL #1   Title Pt will be I with HEP   Status On-going   PT LONG TERM GOAL #3   Title Pt will demo improved ability to tolerate UE MMT resistance without pain   Status Partially Met   PT LONG TERM GOAL #4   Title Pt will be able to perform all ADL's I without an increase in pain   Status On-going   PT LONG TERM GOAL #5   Title Pt will report improved ability to lift water buckets wihtout increased pain in order to tend to her garden I   Baseline had to stop part way to location   Status Partially Met               Plan -  12/21/14 0751    Clinical Impression Statement She did well with new exercises and declined heat after exercise as no pain  Will advance HEP next week and probable discharge   PT Next Visit Plan Review HEp Add as tolerated   Consulted and Agree with Plan of Care Patient        Problem List There are no active problems to display for this patient.   Darrel Hoover PT 12/21/2014, 7:53 AM  [facility] [address] [place], [place], [address] Phone: [phone]   Fax:  [phone]     PHYSICAL THERAPY DISCHARGE SUMMARY  Visits from Start of Care: 7  Current functional Level:  See above   Remaining deficits: As of this visit she was doing well with no pain unless doing more strenuous lifting Cathy Hill , activity   Education / Equipment: HEP for range and strength. She no showed her last 2 visit so I expect she is pleased with current level of function . She new this was her last week.  Plan: Patient agrees to discharge.  Patient goals were partially met. Patient is being discharged due to being pleased with the current functional level.  ?????   Cathy Hill,  PT 12/26/2014 7:34 AM Phone: [phone] Fax: [phone]

## 2014-12-24 ENCOUNTER — Ambulatory Visit: Payer: Managed Care, Other (non HMO)

## 2014-12-26 ENCOUNTER — Ambulatory Visit: Payer: Managed Care, Other (non HMO)

## 2015-01-18 ENCOUNTER — Ambulatory Visit: Payer: No Typology Code available for payment source | Attending: Orthopedic Surgery | Admitting: Physical Therapy

## 2015-01-18 DIAGNOSIS — M25511 Pain in right shoulder: Secondary | ICD-10-CM

## 2015-01-18 DIAGNOSIS — M7918 Myalgia, other site: Secondary | ICD-10-CM

## 2015-01-18 DIAGNOSIS — M791 Myalgia: Secondary | ICD-10-CM | POA: Insufficient documentation
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
## 2016-07-29 ENCOUNTER — Encounter (HOSPITAL_COMMUNITY): Payer: Self-pay | Admitting: *Deleted

## 2016-07-29 ENCOUNTER — Emergency Department (HOSPITAL_COMMUNITY)
Admission: EM | Admit: 2016-07-29 | Discharge: 2016-07-29 | Disposition: A | Discharge status: Home / Self Care | Payer: Managed Care, Other (non HMO) | Attending: Emergency Medicine | Admitting: Emergency Medicine

## 2016-07-29 DIAGNOSIS — N3000 Acute cystitis without hematuria: Secondary | ICD-10-CM

## 2016-07-29 DIAGNOSIS — L03116 Cellulitis of left lower limb: Secondary | ICD-10-CM

## 2016-07-29 DIAGNOSIS — F17210 Nicotine dependence, cigarettes, uncomplicated: Secondary | ICD-10-CM | POA: Insufficient documentation

## 2016-07-29 DIAGNOSIS — Z79899 Other long term (current) drug therapy: Secondary | ICD-10-CM | POA: Insufficient documentation

## 2016-07-29 DIAGNOSIS — J45909 Unspecified asthma, uncomplicated: Secondary | ICD-10-CM | POA: Insufficient documentation

## 2016-07-29 LAB — URINALYSIS, MICROSCOPIC (REFLEX)

## 2016-07-29 LAB — URINALYSIS, ROUTINE W REFLEX MICROSCOPIC
Bilirubin Urine: NEGATIVE
Glucose, UA: NEGATIVE mg/dL
Hgb urine dipstick: NEGATIVE
Ketones, ur: NEGATIVE mg/dL
Nitrite: POSITIVE — AB
Protein, ur: NEGATIVE mg/dL
Specific Gravity, Urine: 1.025 (ref 1.005–1.030)
pH: 6 (ref 5.0–8.0)

## 2016-07-29 LAB — I-STAT BETA HCG BLOOD, ED (MC, WL, AP ONLY)

## 2016-07-29 MED ORDER — DOXYCYCLINE HYCLATE 100 MG PO TABS
100.0000 mg | ORAL_TABLET | Freq: Once | ORAL | Status: AC
Start: 2016-07-29 — End: 2016-07-29
  Administered 2016-07-29: 100 mg via ORAL
  Filled 2016-07-29: qty 1

## 2016-07-29 MED ORDER — HYDROCODONE-ACETAMINOPHEN 5-325 MG PO TABS
1.0000 | ORAL_TABLET | Freq: Once | ORAL | Status: AC
  Administered 2016-07-29: 1 via ORAL
  Filled 2016-07-29: qty 1

## 2016-07-29 MED ORDER — DOXYCYCLINE HYCLATE 100 MG PO CAPS: 100.0000 mg | ORAL_CAPSULE | Freq: Two times a day (BID) | ORAL | 0 refills | Status: DC

## 2016-07-29 MED ORDER — CEPHALEXIN 250 MG PO CAPS
500.0000 mg | ORAL_CAPSULE | Freq: Once | ORAL | Status: AC
  Administered 2016-07-29: 500 mg via ORAL
  Filled 2016-07-29: qty 2

## 2016-07-29 MED ORDER — ACETAMINOPHEN 325 MG PO TABS
650.0000 mg | ORAL_TABLET | Freq: Once | ORAL | Status: AC
  Administered 2016-07-29: 650 mg via ORAL
  Filled 2016-07-29: qty 2

## 2016-07-29 MED ORDER — CEPHALEXIN 500 MG PO CAPS: 500.0000 mg | ORAL_CAPSULE | Freq: Three times a day (TID) | ORAL | 0 refills | Status: DC

## 2016-07-29 NOTE — ED Triage Notes (Signed)
Pt reports having left anterior lower leg pain and swelling since yesterday.denies injury to leg. Having intermittent lower abd pain. Denies urinary or vaginal symptoms.

## 2016-07-29 NOTE — ED Provider Notes (Signed)
[facility]-EMERGENCY DEPT Provider Note   CSN: [id number] Arrival date & time: 07/29/16  4098     History   Chief Complaint Chief Complaint  Patient presents with  . Leg Pain  . Abdominal Pain    HPI Cathy Hill is a 36 y.o. female.  HPI Patient presents to the emergency department complaints of suprapubic abdominal discomfort over the past 48 hours with some urinary frequency.  No dysuria.  No back pain or flank pain.  No fevers or chills.  Denies nausea vomiting.  Symptoms are mild in severity.  No vaginal complaints.  No pain with intercourse.  Also reports mild anterior left lower leg pain over the past 24 hours with warmth.  No drainage.  She does have a history of abscesses.  She states the area is tender to palpation and is slightly swollen.  No injury or trauma to the left lower leg.  No numbness or weakness of left lower leg.  No other complaints.   Past Medical History:  Diagnosis Date  . Asthma   . Eczema     There are no active problems to display for this patient.   Past Surgical History:  Procedure Laterality Date  . CESAREAN SECTION    . KNEE SURGERY      OB History    No data available       Home Medications    Prior to Admission medications   Medication Sig Start Date End Date Taking? Authorizing Provider  acetaminophen (TYLENOL) 500 MG tablet Take 1,000 mg by mouth every 6 (six) hours as needed for mild pain or headache (headache).    Yes Historical Provider, MD  cephALEXin (KEFLEX) 500 MG capsule Take 1 capsule (500 mg total) by mouth 3 (three) times daily. 07/29/16   Azalia Bilis, MD  doxycycline (VIBRAMYCIN) 100 MG capsule Take 1 capsule (100 mg total) by mouth 2 (two) times daily. 07/29/16   Azalia Bilis, MD    Family History History reviewed. No pertinent family history.  Social History Social History  Substance Use Topics  . Smoking status: Current Every Day Smoker    Packs/day: 0.50    Types: Cigarettes  . Smokeless tobacco: Never  Used  . Alcohol use Yes     Comment: occasionally every Saturday, vodka 3 drinks     Allergies   Brompheniramine   Review of Systems Review of Systems  All other systems reviewed and are negative.    Physical Exam Updated Vital Signs BP (!) 166/105   Pulse 61   Temp 97.8 F (36.6 C) (Oral)   Resp 18   SpO2 100%   Physical Exam  Constitutional: She is oriented to person, place, and time. She appears well-developed and well-nourished.  HENT:  Head: Normocephalic.  Eyes: EOM are normal.  Neck: Normal range of motion.  Cardiovascular: Normal rate.   Pulmonary/Chest: Effort normal.  Abdominal: She exhibits no distension.  Mild suprapubic tenderness without guarding or rebound  Musculoskeletal:  Anterior left tibial region with a small area of erythema and warmth and mild swelling without fluctuance or drainage.  Normal pulses left foot.  Full range of motion of major joints of the left lower extremity.  Neurological: She is alert and oriented to person, place, and time.  Psychiatric: She has a normal mood and affect.  Nursing note and vitals reviewed.    ED Treatments / Results  Labs (all labs ordered are listed, but only abnormal results are displayed) Labs Reviewed  URINALYSIS, ROUTINE  W REFLEX MICROSCOPIC - Abnormal; Notable for the following:       Result Value   APPearance HAZY (*)    Nitrite POSITIVE (*)    Leukocytes, UA SMALL (*)    All other components within normal limits  URINALYSIS, MICROSCOPIC (REFLEX) - Abnormal; Notable for the following:    Bacteria, UA MANY (*)    Squamous Epithelial / LPF 0-5 (*)    All other components within normal limits  I-STAT BETA HCG BLOOD, ED ([facility], [facility], [facility] ONLY)    EKG  EKG Interpretation None       Radiology No results found.  Procedures Procedures (including critical care time)  Medications Ordered in ED Medications  cephALEXin (KEFLEX) capsule 500 mg (not administered)  acetaminophen (TYLENOL) tablet  650 mg (650 mg Oral Given 07/29/16 0802)  HYDROcodone-acetaminophen (NORCO/VICODIN) 5-325 MG per tablet 1 tablet (1 tablet Oral Given 07/29/16 0802)  doxycycline (VIBRA-TABS) tablet 100 mg (100 mg Oral Given 07/29/16 0954)     Initial Impression / Assessment and Plan / ED Course  I have reviewed the triage vital signs and the nursing notes.  Pertinent labs & imaging results that were available during my care of the patient were reviewed by me and considered in my medical decision making (see chart for details).     Abdominal pain is likely secondary to acute cystitis.  Patient be discharged on Keflex  Left anterior tibial pain is likely secondary to cellulitis and possibly early developing abscess.  No overt fluctuance or swelling at this time to suggest need for incision and drainage.  We will attempt warm compresses and antibiotics.  She understands return to the ER for new or worsening symptoms.  Home on Doxy  Final Clinical Impressions(s) / ED Diagnoses   Final diagnoses:  Acute cystitis without hematuria  Cellulitis of left anterior lower leg    New Prescriptions New Prescriptions   CEPHALEXIN (KEFLEX) 500 MG CAPSULE    Take 1 capsule (500 mg total) by mouth 3 (three) times daily.   DOXYCYCLINE (VIBRAMYCIN) 100 MG CAPSULE    Take 1 capsule (100 mg total) by mouth 2 (two) times daily.     Azalia Bilis, MD 07/29/16 1059
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
## 2017-07-08 ENCOUNTER — Ambulatory Visit: Payer: Managed Care, Other (non HMO) | Admitting: Family Medicine

## 2017-07-14 ENCOUNTER — Encounter: Payer: Self-pay | Admitting: Family Medicine

## 2017-07-14 ENCOUNTER — Ambulatory Visit (INDEPENDENT_AMBULATORY_CARE_PROVIDER_SITE_OTHER): Payer: 59 | Admitting: Family Medicine

## 2017-07-14 VITALS — BP 122/82 | HR 103 | Temp 97.4°F

## 2017-07-14 DIAGNOSIS — Z7689 Persons encountering health services in other specified circumstances: Secondary | ICD-10-CM

## 2017-07-14 DIAGNOSIS — F17210 Nicotine dependence, cigarettes, uncomplicated: Secondary | ICD-10-CM | POA: Diagnosis not present

## 2017-07-14 DIAGNOSIS — L2082 Flexural eczema: Secondary | ICD-10-CM | POA: Diagnosis not present

## 2017-07-14 MED ORDER — TRIAMCINOLONE ACETONIDE 0.1 % EX CREA: 1.0000 "application " | TOPICAL_CREAM | Freq: Two times a day (BID) | CUTANEOUS | 1 refills | Status: DC
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
## 2017-07-14 NOTE — Patient Instructions (Signed)
Eczema Eczema is a broad term for a group of skin conditions that cause skin to become rough and inflamed. Each type of eczema has different triggers, symptoms, and treatments. Eczema of any type is usually itchy and symptoms range from mild to severe. Eczema and its symptoms are not spread from person to person (are not contagious). It can appear on different parts of the body at different times. Your eczema may not look the same as someone else's eczema. What are the types of eczema? Atopic dermatitis This is a long-term (chronic) skin disease that keeps coming back (recurring). Usual symptoms are dry skin and small, solid pimples that may swell and leak fluid (weep). Contact dermatitis This happens when something irritates the skin and causes a rash. The irritation can come from substances that you are allergic to (allergens), such as poison ivy, chemicals, or medicines that were applied to your skin. Dyshidrotic eczema This is a form of eczema on the hands and feet. It shows up as very itchy, fluid-filled blisters. It can affect people of any age, but is more common before age 40. Hand eczema This causes very itchy areas of skin on the palms and sides of the hands and fingers. This type of eczema is common in industrial jobs where you may be exposed to many different types of irritants. Lichen simplex chronicus This type of eczema occurs when a person constantly scratches one area of the body. Repeated scratching of the area leads to thickened skin (lichenification). Lichen simplex chronicus can occur along with other types of eczema. It is more common in adults, but may be seen in children as well. Nummular eczema This is a common type of eczema. It has no known cause. It typically causes a red, circular, crusty lesion (plaque) that may be itchy. Scratching may become a habit and can cause bleeding. Nummular eczema occurs most often in people of middle-age or older. It most often affects the  hands. Seborrheic dermatitis This is a common skin disease that mainly affects the scalp. It may also affect any oily areas of the body, such as the face, sides of nose, eyebrows, ears, eyelids, and chest. It is marked by small scaling and redness of the skin (erythema). This can affect people of all ages. In infants, this condition is known as "cradle cap." Stasis dermatitis This is a common skin disease that usually appears on the legs and feet. It most often occurs in people who have a condition that prevents blood from being pumped through the veins in the legs (chronic venous insufficiency). Stasis dermatitis is a chronic condition that needs long-term management. How is eczema diagnosed? Your health care provider will examine your skin and review your medical history. He or she may also give you skin patch tests. These tests involve taking patches that contain possible allergens and placing them on your back. He or she will then check in a few days to see if an allergic reaction occurred. What are the common treatments? Treatment for eczema is based on the type of eczema you have. Hydrocortisone steroid medicine can relieve itching quickly and help reduce inflammation. This medicine may be prescribed or obtained over-the-counter, depending on the strength of the medicine that is needed. Follow these instructions at home:  Take over-the-counter and prescription medicines only as told by your health care provider.  Use creams or ointments to moisturize your skin. Do not use lotions.  Learn what triggers or irritates your symptoms. Avoid these things.  Treat   symptom flare-ups quickly.  Do not itch your skin. This can make your rash worse.  Keep all follow-up visits as told by your health care provider. This is important. Where to find more information:  The American Academy of Dermatology: InfoExam.si  The National Eczema Association: www.nationaleczema.org Contact a health care  provider if:  You have serious itching, even with treatment.  You regularly scratch your skin until it bleeds.  Your rash looks different than usual.  Your skin is painful, swollen, or more red than usual.  You have a fever. Summary  There are eight general types of eczema. Each type has different triggers.  Eczema of any type causes itching that may range from mild to severe.  Treatment varies based on the type of eczema you have. Hydrocortisone steroid medicine can help with itching and inflammation.  Protecting your skin is the best way to prevent eczema. Use moisturizers and lotions. Avoid triggers and irritants, and treat flare-ups quickly. This information is not intended to replace advice given to you by your health care provider. Make sure you discuss any questions you have with your health care provider. Document Released: 08/13/2016 Document Revised: 08/13/2016 Document Reviewed: 08/13/2016 Elsevier Interactive Patient Education  2018 [employer].  Steps to Quit Smoking Smoking tobacco can be harmful to your health and can affect almost every organ in your body. Smoking puts you, and those around you, at risk for developing many serious chronic diseases. Quitting smoking is difficult, but it is one of the best things that you can do for your health. It is never too late to quit. What are the benefits of quitting smoking? When you quit smoking, you lower your risk of developing serious diseases and conditions, such as:  Lung cancer or lung disease, such as COPD.  Heart disease.  Stroke.  Heart attack.  Infertility.  Osteoporosis and bone fractures.  Additionally, symptoms such as coughing, wheezing, and shortness of breath may get better when you quit. You may also find that you get sick less often because your body is stronger at fighting off colds and infections. If you are pregnant, quitting smoking can help to reduce your chances of having a baby of low birth  weight. How do I get ready to quit? When you decide to quit smoking, create a plan to make sure that you are successful. Before you quit:  Pick a date to quit. Set a date within the next two weeks to give you time to prepare.  Write down the reasons why you are quitting. Keep this list in places where you will see it often, such as on your bathroom mirror or in your car or wallet.  Identify the people, places, things, and activities that make you want to smoke (triggers) and avoid them. Make sure to take these actions: ? Throw away all cigarettes at home, at work, and in your car. ? Throw away smoking accessories, such as Set designer. ? Clean your car and make sure to empty the ashtray. ? Clean your home, including curtains and carpets.  Tell your family, friends, and coworkers that you are quitting. Support from your loved ones can make quitting easier.  Talk with your health care provider about your options for quitting smoking.  Find out what treatment options are covered by your health insurance.  What strategies can I use to quit smoking? Talk with your healthcare provider about different strategies to quit smoking. Some strategies include:  Quitting smoking altogether instead of gradually  lessening how much you smoke over a period of time. Research shows that quitting "cold [place]key" is more successful than gradually quitting.  Attending in-person counseling to help you build problem-solving skills. You are more likely to have success in quitting if you attend several counseling sessions. Even short sessions of 10 minutes can be effective.  Finding resources and support systems that can help you to quit smoking and remain smoke-free after you quit. These resources are most helpful when you use them often. They can include: ? Online chats with a Veterinary surgeoncounselor. ? Telephone quitlines. ? Automotive engineerrinted self-help materials. ? Support groups or group counseling. ? Text messaging  programs. ? Mobile phone applications.  Taking medicines to help you quit smoking. (If you are pregnant or breastfeeding, talk with your health care provider first.) Some medicines contain nicotine and some do not. Both types of medicines help with cravings, but the medicines that include nicotine help to relieve withdrawal symptoms. Your health care provider may recommend: ? Nicotine patches, gum, or lozenges. ? Nicotine inhalers or sprays. ? Non-nicotine medicine that is taken by mouth.  Talk with your health care provider about combining strategies, such as taking medicines while you are also receiving in-person counseling. Using these two strategies together makes you more likely to succeed in quitting than if you used either strategy on its own. If you are pregnant or breastfeeding, talk with your health care provider about finding counseling or other support strategies to quit smoking. Do not take medicine to help you quit smoking unless told to do so by your health care provider. What things can I do to make it easier to quit? Quitting smoking might feel overwhelming at first, but there is a lot that you can do to make it easier. Take these important actions:  Reach out to your family and friends and ask that they support and encourage you during this time. Call telephone quitlines, reach out to support groups, or work with a counselor for support.  Ask people who smoke to avoid smoking around you.  Avoid places that trigger you to smoke, such as bars, parties, or smoke-break areas at work.  Spend time around people who do not smoke.  Lessen stress in your life, because stress can be a smoking trigger for some people. To lessen stress, try: ? Exercising regularly. ? Deep-breathing exercises. ? Yoga. ? Meditating. ? Performing a body scan. This involves closing your eyes, scanning your body from head to toe, and noticing which parts of your body are particularly tense. Purposefully  relax the muscles in those areas.  Download or purchase mobile phone or tablet apps (applications) that can help you stick to your quit plan by providing reminders, tips, and encouragement. There are many free apps, such as QuitGuide from the [employer] developer(Centers for Disease Control and Prevention). You can find other support for quitting smoking (smoking cessation) through smokefree.gov and other websites.  How will I feel when I quit smoking? Within the first 24 hours of quitting smoking, you may start to feel some withdrawal symptoms. These symptoms are usually most noticeable 2-3 days after quitting, but they usually do not last beyond 2-3 weeks. Changes or symptoms that you might experience include:  Mood swings.  Restlessness, anxiety, or irritation.  Difficulty concentrating.  Dizziness.  Strong cravings for sugary foods in addition to nicotine.  Mild weight gain.  Constipation.  Nausea.  Coughing or a sore throat.  Changes in how your medicines work in your body.  A  depressed mood.  Difficulty sleeping (insomnia).  After the first 2-3 weeks of quitting, you may start to notice more positive results, such as:  Improved sense of smell and taste.  Decreased coughing and sore throat.  Slower heart rate.  Lower blood pressure.  Clearer skin.  The ability to breathe more easily.  Fewer sick days.  Quitting smoking is very challenging for most people. Do not get discouraged if you are not successful the first time. Some people need to make many attempts to quit before they achieve long-term success. Do your best to stick to your quit plan, and talk with your health care provider if you have any questions or concerns. This information is not intended to replace advice given to you by your health care provider. Make sure you discuss any questions you have with your health care provider. Document Released: 03/24/2001 Document Revised: 11/26/2015 Document Reviewed:  08/14/2014 Elsevier Interactive Patient Education  [employer].

## 2017-07-16 ENCOUNTER — Telehealth: Payer: Self-pay | Admitting: Family Medicine

## 2017-07-16 NOTE — Telephone Encounter (Signed)
Copied from CRM [phone]#[id number]. Topic: Quick Communication - Rx Refill/Question >> Jul 16, 2017 11:19 AM Raquel SarnaHayes, Teresa G wrote: doxycycline (VIBRAMYCIN) 100 MG capsule cephALEXin (KEFLEX) 500 MG capsule acetaminophen (TYLENOL) 500 MG tablet   [id number] [place] - [address] [place] [id number] Phone: [phone] Fax: [phone]

## 2017-07-16 NOTE — Telephone Encounter (Signed)
Spoke with pt. About her refill request on antibiotics. States that was a mistake - they were on an old medication list. No refills.

## 2017-08-04 ENCOUNTER — Encounter: Payer: 59 | Admitting: Family Medicine

## 2017-08-04 DIAGNOSIS — Z2089 Contact with and (suspected) exposure to other communicable diseases: Secondary | ICD-10-CM

## 2018-02-20 ENCOUNTER — Other Ambulatory Visit: Payer: Self-pay

## 2018-02-20 ENCOUNTER — Emergency Department (HOSPITAL_COMMUNITY)
Admission: EM | Admit: 2018-02-20 | Discharge: 2018-02-20 | Disposition: A | Discharge status: Home / Self Care | Payer: 59 | Attending: Emergency Medicine | Admitting: Emergency Medicine

## 2018-02-20 ENCOUNTER — Encounter (HOSPITAL_COMMUNITY): Payer: Self-pay | Admitting: Oncology

## 2018-02-20 DIAGNOSIS — I10 Essential (primary) hypertension: Secondary | ICD-10-CM

## 2018-02-20 DIAGNOSIS — F17210 Nicotine dependence, cigarettes, uncomplicated: Secondary | ICD-10-CM | POA: Insufficient documentation

## 2018-02-20 DIAGNOSIS — J45909 Unspecified asthma, uncomplicated: Secondary | ICD-10-CM | POA: Insufficient documentation

## 2018-02-20 LAB — I-STAT CHEM 8, ED
BUN: 13 mg/dL (ref 6–20)
CHLORIDE: 104 mmol/L (ref 98–111)
CREATININE: 0.8 mg/dL (ref 0.44–1.00)
Calcium, Ion: 1.14 mmol/L — ABNORMAL LOW (ref 1.15–1.40)
Glucose, Bld: 95 mg/dL (ref 70–99)
HCT: 42 % (ref 36.0–46.0)
Hemoglobin: 14.3 g/dL (ref 12.0–15.0)
POTASSIUM: 3.4 mmol/L — AB (ref 3.5–5.1)
SODIUM: 138 mmol/L (ref 135–145)
TCO2: 26 mmol/L (ref 22–32)

## 2018-02-20 LAB — I-STAT BETA HCG BLOOD, ED (MC, WL, AP ONLY): I-stat hCG, quantitative: <5 m[IU]/mL (ref <5)

## 2018-02-20 MED ORDER — HYDROCHLOROTHIAZIDE 25 MG PO TABS: 25.0000 mg | ORAL_TABLET | Freq: Every day | ORAL | 0 refills | Status: DC

## 2018-02-20 MED ORDER — HYDROCHLOROTHIAZIDE 25 MG PO TABS
25.0000 mg | ORAL_TABLET | Freq: Every day | ORAL | Status: DC
  Administered 2018-02-20: 25 mg via ORAL
  Filled 2018-02-20: qty 1

## 2018-02-20 NOTE — ED Provider Notes (Signed)
[facility] EMERGENCY DEPARTMENT Provider Note   CSN: [id number] Arrival date & time: 02/20/18  2143     History   Chief Complaint Chief Complaint  Patient presents with  . Hypertension    HPI Cathy Hill is a 37 y.o. female.  HPI  37 year old female presents today complaining of hypertension.  She states she has a family history of hypertension but has not had known hypertension.  She took her blood pressure today at a drugstore and noted that it was high.  She returned several hours later and noted again that it was high with a systolic blood pressure between 150 and 170.  She has had some blurry vision and some mild headache.  She presents to the ED due to concerns regarding high blood pressure.  She denies chest pain, shortness of breath, lateralized weakness, or severe headache and she does not have the last time she had her blood pressure checked.  Past Medical History:  Diagnosis Date  . Asthma   . Eczema     There are no active problems to display for this patient.   Past Surgical History:  Procedure Laterality Date  . CESAREAN SECTION    . KNEE SURGERY       OB History   None      Home Medications    Prior to Admission medications   Medication Sig Start Date End Date Taking? Authorizing Provider  acetaminophen (TYLENOL) 500 MG tablet Take 1,000 mg by mouth every 6 (six) hours as needed for mild pain or headache (headache).     [provider]  cephALEXin (KEFLEX) 500 MG capsule Take 1 capsule (500 mg total) by mouth 3 (three) times daily. 07/29/16   Azalia Bilis, MD  doxycycline (VIBRAMYCIN) 100 MG capsule Take 1 capsule (100 mg total) by mouth 2 (two) times daily. 07/29/16   Azalia Bilis, MD  triamcinolone cream (KENALOG) 0.1 % Apply 1 application topically 2 (two) times daily. 07/14/17   Deeann Saint, MD    Family History No family history on file.  Social History Social History   Tobacco Use  . Smoking  status: Current Every Day Smoker    Packs/day: 0.50    Types: Cigarettes  . Smokeless tobacco: Never Used  Substance Use Topics  . Alcohol use: Yes    Comment: occasionally every Saturday, vodka 3 drinks  . Drug use: No     Allergies   Brompheniramine   Review of Systems Review of Systems  All other systems reviewed and are negative.    Physical Exam Updated Vital Signs BP (!) 183/101 (BP Location: Left Arm)   Pulse 88   Temp 98.1 F (36.7 C) (Oral)   Resp 16   Ht 1.594 m (5' 2.75")   Wt 78 kg   LMP  (LMP Unknown)   SpO2 100%   BMI 30.71 kg/m   Physical Exam  Constitutional: She is oriented to person, place, and time. She appears well-developed and well-nourished.  HENT:  Head: Normocephalic and atraumatic.  Right Ear: External ear normal.  Left Ear: External ear normal.  Nose: Nose normal.  Mouth/Throat: Oropharynx is clear and moist.  Eyes: Pupils are equal, round, and reactive to light. EOM are normal.  Neck: Normal range of motion. Neck supple.  Cardiovascular: Normal rate, regular rhythm, normal heart sounds and intact distal pulses.  Pulmonary/Chest: Effort normal and breath sounds normal.  Abdominal: Soft. Bowel sounds are normal.  Musculoskeletal: Normal range of motion.  Neurological: She is alert and oriented to person, place, and time. She displays normal reflexes. No cranial nerve deficit or sensory deficit. She exhibits normal muscle tone. Coordination normal.  Skin: Skin is warm. Capillary refill takes less than 2 seconds.  Psychiatric: She has a normal mood and affect. Her behavior is normal. Thought content normal.  Nursing note and vitals reviewed.    ED Treatments / Results  Labs (all labs ordered are listed, but only abnormal results are displayed) Labs Reviewed  I-STAT CHEM 8, ED - Abnormal; Notable for the following components:      Result Value   Potassium 3.4 (*)    Calcium, Ion 1.14 (*)    All other components within normal  limits  I-STAT BETA HCG BLOOD, ED ([facility], [facility], [facility] ONLY)    EKG None  Radiology No results found.  Procedures Procedures (including critical care time)  Medications Ordered in ED Medications - No data to display   Initial Impression / Assessment and Plan / ED Course  I have reviewed the triage vital signs and the nursing notes.  Pertinent labs & imaging results that were available during my care of the patient were reviewed by me and considered in my medical decision making (see chart for details).     Patient appears stable here.  Blood pressure was elevated at 183/100 while blood was being drawn.  Patient is very afraid of needles.  We will recheck now.  Renal function appears normal.  She does not have any significant headache and I do not think she needs head CT.  There is no chest pain.  Will start hydrochlorothiazide and patient advised regarding return precautions and need for follow-up and voices understanding.  Final Clinical Impressions(s) / ED Diagnoses   Final diagnoses:  Hypertension, unspecified type    ED Discharge Orders    None       Margarita Grizzle, MD 02/20/18 2238

## 2018-02-20 NOTE — ED Triage Notes (Signed)
Pt c/o HTN.  States that she had her BP taken at [facility] and noted it to be high.  Denies hx of HTN.

## 2018-02-20 NOTE — Discharge Instructions (Addendum)
Please follow up and have blood pressure checked after on new medication for one week.

## 2018-02-20 NOTE — ED Notes (Signed)
Patient verbalizes understanding of discharge instructions. Opportunity for questioning and answers were provided. Armband removed by staff, pt discharged from ED ambulatory.   

## 2019-03-01 ENCOUNTER — Emergency Department (HOSPITAL_COMMUNITY)
Admission: EM | Admit: 2019-03-01 | Discharge: 2019-03-01 | Disposition: A | Discharge status: Home / Self Care | Payer: Self-pay | Attending: Emergency Medicine | Admitting: Emergency Medicine

## 2019-03-01 ENCOUNTER — Encounter (HOSPITAL_COMMUNITY): Payer: Self-pay

## 2019-03-01 ENCOUNTER — Other Ambulatory Visit: Payer: Self-pay

## 2019-03-01 DIAGNOSIS — R519 Headache, unspecified: Secondary | ICD-10-CM | POA: Insufficient documentation

## 2019-03-01 DIAGNOSIS — F17210 Nicotine dependence, cigarettes, uncomplicated: Secondary | ICD-10-CM | POA: Insufficient documentation

## 2019-03-01 DIAGNOSIS — Z9114 Patient's other noncompliance with medication regimen: Secondary | ICD-10-CM | POA: Insufficient documentation

## 2019-03-01 DIAGNOSIS — I10 Essential (primary) hypertension: Secondary | ICD-10-CM | POA: Insufficient documentation

## 2019-03-01 DIAGNOSIS — J45909 Unspecified asthma, uncomplicated: Secondary | ICD-10-CM | POA: Insufficient documentation

## 2019-03-01 DIAGNOSIS — R030 Elevated blood-pressure reading, without diagnosis of hypertension: Secondary | ICD-10-CM

## 2019-03-01 HISTORY — DX: Essential (primary) hypertension: I10

## 2019-03-01 LAB — HCG, QUANTITATIVE, PREGNANCY: hCG, Beta Chain, Quant, S: <1 m[IU]/mL (ref <5)

## 2019-03-01 LAB — I-STAT CHEM 8, ED
BUN: 11 mg/dL (ref 6–20)
Calcium, Ion: 1.17 mmol/L (ref 1.15–1.40)
Chloride: 103 mmol/L (ref 98–111)
Creatinine, Ser: 0.8 mg/dL (ref 0.44–1.00)
Glucose, Bld: 76 mg/dL (ref 70–99)
HCT: 43 % (ref 36.0–46.0)
Hemoglobin: 14.6 g/dL (ref 12.0–15.0)
Potassium: 4.6 mmol/L (ref 3.5–5.1)
Sodium: 138 mmol/L (ref 135–145)
TCO2: 25 mmol/L (ref 22–32)

## 2019-03-01 MED ORDER — HYDROCHLOROTHIAZIDE 25 MG PO TABS: 25.0000 mg | ORAL_TABLET | Freq: Every day | ORAL | 0 refills | Status: DC

## 2019-03-01 MED ORDER — PROCHLORPERAZINE EDISYLATE 10 MG/2ML IJ SOLN
10.0000 mg | Freq: Once | INTRAMUSCULAR | Status: DC
  Filled 2019-03-01: qty 2

## 2019-03-01 MED ORDER — HYDROCHLOROTHIAZIDE 25 MG PO TABS
25.0000 mg | ORAL_TABLET | Freq: Every day | ORAL | Status: DC
  Administered 2019-03-01: 25 mg via ORAL
  Filled 2019-03-01: qty 1

## 2019-03-01 MED ORDER — DIPHENHYDRAMINE HCL 50 MG/ML IJ SOLN
25.0000 mg | Freq: Once | INTRAMUSCULAR | Status: DC
  Filled 2019-03-01: qty 1

## 2019-03-01 NOTE — ED Triage Notes (Signed)
Pt c/o of severe headache due to BP 173/108 that [facility]. Pt took 2 aspirin before arrival. Hx: hypertension. Currently not taking any BP meds.

## 2019-03-01 NOTE — ED Provider Notes (Signed)
[facility]-EMERGENCY DEPT Provider Note   CSN: [id number] Arrival date & time: 03/01/19  1025     History   Chief Complaint No chief complaint on file.   HPI Cathy Hill is a 38 y.o. female.     38 y.o female with a PMH of HTN non compliant with medication sent to the ED with a chief complaint of headache times this morning.  Patient reports waking up with a sharp pressure between her temples with radiation onto her orbits, reports this pain is exacerbated with light.  She reports going to [facility] and having her blood pressure measured x2, was getting a reading of 173/108.  She does state a similar episode a year ago and were her blood pressure elevated and she came into the ED and obtain medication to control her blood pressure.  Patient reports she only took this blood pressure medication for 1 month, she attempted to change her diet along with lifestyle changes.  Today she reports her diet along with lifestyle changes are back to the way they used to be.  She does not have any nausea, vomiting, blurred vision, neck pain, tingling, fevers.  No chest pain or shortness of breath.  The history is provided by the patient.    Past Medical History:  Diagnosis Date  . Asthma   . Eczema   . Hypertension     There are no active problems to display for this patient.   Past Surgical History:  Procedure Laterality Date  . CESAREAN SECTION    . KNEE SURGERY       OB History   No obstetric history on file.      Home Medications    Prior to Admission medications   Medication Sig Start Date End Date Taking? Authorizing Provider  acetaminophen (TYLENOL) 500 MG tablet Take 1,000 mg by mouth every 6 (six) hours as needed for mild pain or headache (headache).    Yes [provider]  cephALEXin (KEFLEX) 500 MG capsule Take 1 capsule (500 mg total) by mouth 3 (three) times daily. Patient not taking: Reported on 03/01/2019 07/29/16   Azalia Bilisampos, Kevin, MD   doxycycline (VIBRAMYCIN) 100 MG capsule Take 1 capsule (100 mg total) by mouth 2 (two) times daily. Patient not taking: Reported on 03/01/2019 07/29/16   Azalia Bilisampos, Kevin, MD  hydrochlorothiazide (HYDRODIURIL) 25 MG tablet Take 1 tablet (25 mg total) by mouth daily. 03/01/19 03/31/19  Claude MangesSoto, Yentl Verge, PA-C  triamcinolone cream (KENALOG) 0.1 % Apply 1 application topically 2 (two) times daily. Patient not taking: Reported on 03/01/2019 07/14/17   Deeann SaintBanks, Shannon R, MD    Family History No family history on file.  Social History Social History   Tobacco Use  . Smoking status: Current Every Day Smoker    Packs/day: 1.00    Types: Cigarettes  . Smokeless tobacco: Never Used  Substance Use Topics  . Alcohol use: Yes    Comment: occasionally every Saturday, vodka 3 drinks  . Drug use: No     Allergies   Brompheniramine   Review of Systems Review of Systems  Constitutional: Negative for chills and fever.  HENT: Negative for ear pain and sore throat.   Eyes: Positive for photophobia. Negative for pain and visual disturbance.  Respiratory: Negative for cough and shortness of breath.   Cardiovascular: Negative for chest pain and palpitations.  Gastrointestinal: Negative for abdominal pain and vomiting.  Genitourinary: Negative for dysuria and hematuria.  Musculoskeletal: Negative for arthralgias and back  pain.  Skin: Negative for color change and rash.  Neurological: Positive for headaches. Negative for seizures and syncope.  All other systems reviewed and are negative.    Physical Exam Updated Vital Signs BP (!) 179/104   Pulse 62   Temp 98.6 F (37 C) (Oral)   Resp 15   Ht 5\' 10"  (1.778 m)   Wt 73.9 kg   LMP 02/10/2019   SpO2 99%   BMI 23.39 kg/m   Physical Exam Vitals signs and nursing note reviewed.  Constitutional:      General: She is not in acute distress.    Appearance: She is well-developed.  HENT:     Head: Normocephalic and atraumatic.     Mouth/Throat:      Pharynx: No oropharyngeal exudate.  Eyes:     Pupils: Pupils are equal, round, and reactive to light.  Neck:     Musculoskeletal: Normal range of motion.  Cardiovascular:     Rate and Rhythm: Regular rhythm.     Heart sounds: Normal heart sounds.  Pulmonary:     Effort: Pulmonary effort is normal. No respiratory distress.     Breath sounds: Normal breath sounds.  Abdominal:     General: Bowel sounds are normal. There is no distension.     Palpations: Abdomen is soft.     Tenderness: There is no abdominal tenderness.  Musculoskeletal:        General: No tenderness or deformity.     Right lower leg: No edema.     Left lower leg: No edema.  Skin:    General: Skin is warm and dry.  Neurological:     Mental Status: She is alert and oriented to person, place, and time.     Comments: Alert, oriented, thought content appropriate. Speech fluent without evidence of aphasia. Able to follow 2 step commands without difficulty.  Cranial Nerves:  II:  Peripheral visual fields grossly normal, pupils, round, reactive to light III,IV, VI: ptosis not present, extra-ocular motions intact bilaterally  V,VII: smile symmetric, facial light touch sensation equal VIII: hearing grossly normal bilaterally  IX,X: midline uvula rise  XI: bilateral shoulder shrug equal and strong XII: midline tongue extension  Motor:  5/5 in upper and lower extremities bilaterally including strong and equal grip strength and dorsiflexion/plantar flexion Sensory: light touch normal in all extremities.  Cerebellar: normal finger-to-nose with bilateral upper extremities, pronator drift negative Gait: normal gait and balance       ED Treatments / Results  Labs (all labs ordered are listed, but only abnormal results are displayed) Labs Reviewed  HCG, QUANTITATIVE, PREGNANCY  I-STAT CHEM 8, ED    EKG None  Radiology No results found.  Procedures Procedures (including critical care time)  Medications Ordered in  ED Medications  hydrochlorothiazide (HYDRODIURIL) tablet 25 mg (has no administration in time range)     Initial Impression / Assessment and Plan / ED Course  I have reviewed the triage vital signs and the nursing notes.  Pertinent labs & imaging results that were available during my care of the patient were reviewed by me and considered in my medical decision making (see chart for details).      Patient with a past medical history of hypertension currently noncompliant with medication presents to the ED with complaints of headache along with elevated blood pressure.  Patient reports she woke up with a headache which she describes a similar episode in the past, [facility]  [facility] and this was found to be elevated. Patient reports has had no nausea, vomiting, neck pain, fevers.  No weakness dizziness or lightheadedness.  She does report a similar episode a year ago she was placed then home blood pressure medication which she stopped taking as she had lifestyle changes.  She reports only taking the blood pressure medication for a month.  According to her last visit which have extensively review, she was seen in the ED exactly a year ago was given a thiazide to lower her blood pressure with provement in her headache.  On today's evaluation she is neurologically intact, reports her headache has subsided after 2 aspirins.  She does not have any chest pain, shortness of breath, dizziness or tingling feeling.  Will obtain a Chem-8 along with a pregnancy test in order to provide her with some blood pressure control.  Patient is advised that she will need to follow-up with a PCP, does not have one on file.  Chem 8 results showed no electrolyte derailment, no hypokalemia. Creatine level is within normal limits, no signs of AKI. Pregnancy test is negative.   1:35 PM patient was persistently hypertensive in the ED, with systolic pressures in the 962E, diastolics in the low  366Q, she reports her headache has improved in nature.  She does not have any chest pain, shortness of breath, acute kidney injury.  Patient used to be prescribed HCTZ to control her high blood pressure, she was educated on this medication and advised that she will need a PCP in order to have labs rechecked within a 31-month span.  Patient understands and agrees with management, she will be provided with oral HCTZ as her pregnancy test resulted negative.  She is aware she will need to establish PCP care in order to continue this medication.  She will be provided today with a prescription for 30-day supply.   Portions of this note were generated with [employer]. Dictation errors may occur despite best attempts at proofreading.  Final Clinical Impressions(s) / ED Diagnoses   Final diagnoses:  Elevated blood pressure reading  Nonintractable episodic headache, unspecified headache type    ED Discharge Orders         Ordered    hydrochlorothiazide (HYDRODIURIL) 25 MG tablet  Daily     03/01/19 1336           [name], PA-C 03/01/19 1337    Dorie Rank, MD 03/01/19 1955

## 2019-03-01 NOTE — Discharge Instructions (Signed)
I have prescribed medication to help with your blood pressure, please take 1 tablet once a day for the next 30 days.  Please be aware you will need to have your blood work checked within a 69-month span after starting this medication, please schedule an appointment with your primary care physician for further management.  If you experience any headaches, shortness of breath, chest pain or worsening symptoms please return to the emergency room.

## 2019-06-08 ENCOUNTER — Other Ambulatory Visit: Payer: Self-pay

## 2019-06-08 ENCOUNTER — Emergency Department (HOSPITAL_COMMUNITY)
Admission: EM | Admit: 2019-06-08 | Discharge: 2019-06-08 | Disposition: A | Discharge status: Home / Self Care | Payer: BC Managed Care – PPO | Attending: Emergency Medicine | Admitting: Emergency Medicine

## 2019-06-08 ENCOUNTER — Encounter (HOSPITAL_COMMUNITY): Payer: Self-pay | Admitting: Emergency Medicine

## 2019-06-08 ENCOUNTER — Emergency Department (HOSPITAL_COMMUNITY): Payer: BC Managed Care – PPO

## 2019-06-08 DIAGNOSIS — R0789 Other chest pain: Secondary | ICD-10-CM | POA: Diagnosis present

## 2019-06-08 DIAGNOSIS — F410 Panic disorder [episodic paroxysmal anxiety] without agoraphobia: Secondary | ICD-10-CM | POA: Diagnosis not present

## 2019-06-08 DIAGNOSIS — Z79899 Other long term (current) drug therapy: Secondary | ICD-10-CM | POA: Diagnosis not present

## 2019-06-08 DIAGNOSIS — F17210 Nicotine dependence, cigarettes, uncomplicated: Secondary | ICD-10-CM | POA: Diagnosis not present

## 2019-06-08 DIAGNOSIS — I10 Essential (primary) hypertension: Secondary | ICD-10-CM | POA: Insufficient documentation

## 2019-06-08 LAB — CBC
HCT: 41.6 % (ref 36.0–46.0)
Hemoglobin: 13.2 g/dL (ref 12.0–15.0)
MCH: 27.6 pg (ref 26.0–34.0)
MCHC: 31.7 g/dL (ref 30.0–36.0)
MCV: 86.8 fL (ref 80.0–100.0)
Platelets: 295 10*3/uL (ref 150–400)
RBC: 4.79 MIL/uL (ref 3.87–5.11)
RDW: 14.9 % (ref 11.5–15.5)
WBC: 3.9 10*3/uL — ABNORMAL LOW (ref 4.0–10.5)
nRBC: 0 % (ref 0.0–0.2)

## 2019-06-08 LAB — COMPREHENSIVE METABOLIC PANEL
ALT: 13 U/L (ref 0–44)
AST: 15 U/L (ref 15–41)
Albumin: 4 g/dL (ref 3.5–5.0)
Alkaline Phosphatase: 49 U/L (ref 38–126)
Anion gap: 6 (ref 5–15)
BUN: 12 mg/dL (ref 6–20)
CO2: 27 mmol/L (ref 22–32)
Calcium: 9.3 mg/dL (ref 8.9–10.3)
Chloride: 104 mmol/L (ref 98–111)
Creatinine, Ser: 0.74 mg/dL (ref 0.44–1.00)
GFR calc Af Amer: >60 mL/min (ref >60)
GFR calc non Af Amer: >60 mL/min (ref >60)
Glucose, Bld: 82 mg/dL (ref 70–99)
Potassium: 4.2 mmol/L (ref 3.5–5.1)
Sodium: 137 mmol/L (ref 135–145)
Total Bilirubin: 0.6 mg/dL (ref 0.3–1.2)
Total Protein: 7.8 g/dL (ref 6.5–8.1)

## 2019-06-08 LAB — TROPONIN I (HIGH SENSITIVITY): Troponin I (High Sensitivity): <2 ng/L (ref <18)

## 2019-06-08 LAB — POC URINE PREG, ED: Preg Test, Ur: NEGATIVE

## 2019-06-08 MED ORDER — HYDROXYZINE HCL 10 MG PO TABS: 10.0000 mg | ORAL_TABLET | Freq: Three times a day (TID) | ORAL | 0 refills | Status: AC | PRN

## 2019-06-08 MED ORDER — HYDROXYZINE HCL 10 MG PO TABS
10.0000 mg | ORAL_TABLET | Freq: Once | ORAL | Status: AC
  Administered 2019-06-08: 11:00:00 10 mg via ORAL
  Filled 2019-06-08: qty 1

## 2019-06-08 NOTE — Discharge Instructions (Addendum)
We have ruled out a serious heart cause of your symptoms today. Your thyroid lab will be resulted after your discharge but you can see the results on mychart. I hope that you're able to find a resolution in your work situation. Please call your insurance provider- [employer], [employer] or visit their website to find a provider covered in their network to help address your feelings.
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
## 2019-08-02 ENCOUNTER — Encounter (HOSPITAL_COMMUNITY): Payer: Self-pay

## 2019-08-02 ENCOUNTER — Emergency Department (HOSPITAL_COMMUNITY)
Admission: EM | Admit: 2019-08-02 | Discharge: 2019-08-02 | Disposition: A | Discharge status: Home / Self Care | Payer: BC Managed Care – PPO | Attending: Emergency Medicine | Admitting: Emergency Medicine

## 2019-08-02 ENCOUNTER — Other Ambulatory Visit: Payer: Self-pay

## 2019-08-02 DIAGNOSIS — Z79899 Other long term (current) drug therapy: Secondary | ICD-10-CM | POA: Insufficient documentation

## 2019-08-02 DIAGNOSIS — J45909 Unspecified asthma, uncomplicated: Secondary | ICD-10-CM | POA: Insufficient documentation

## 2019-08-02 DIAGNOSIS — L301 Dyshidrosis [pompholyx]: Secondary | ICD-10-CM | POA: Insufficient documentation

## 2019-08-02 DIAGNOSIS — F17210 Nicotine dependence, cigarettes, uncomplicated: Secondary | ICD-10-CM | POA: Insufficient documentation

## 2019-08-02 DIAGNOSIS — I10 Essential (primary) hypertension: Secondary | ICD-10-CM | POA: Insufficient documentation

## 2019-08-02 MED ORDER — HYDROXYZINE HCL 25 MG PO TABS
25.0000 mg | ORAL_TABLET | Freq: Once | ORAL | Status: AC
  Administered 2019-08-02: 25 mg via ORAL
  Filled 2019-08-02: qty 1

## 2019-08-02 MED ORDER — PREDNISONE 20 MG PO TABS
60.0000 mg | ORAL_TABLET | Freq: Once | ORAL | Status: AC
  Administered 2019-08-02: 60 mg via ORAL
  Filled 2019-08-02: qty 3

## 2019-08-02 MED ORDER — PREDNISONE 20 MG PO TABS: 40.0000 mg | ORAL_TABLET | Freq: Every day | ORAL | 0 refills | Status: DC

## 2019-08-02 MED ORDER — TRIAMCINOLONE ACETONIDE 0.5 % EX OINT: 1.0000 "application " | TOPICAL_OINTMENT | Freq: Two times a day (BID) | CUTANEOUS | 0 refills | Status: DC

## 2019-08-02 NOTE — ED Triage Notes (Signed)
Patient arrived stating she has a rash bilaterally on her hands, feet, and scalp. Due to history of eczema attempted to treat at home with no relief. Reporting pain and itching.

## 2019-08-02 NOTE — ED Provider Notes (Signed)
[facility] DEPT Provider Note   CSN: [id number] Arrival date & time: 08/02/19  0453     History Chief Complaint  Patient presents with  . Rash    Cathy Hill is a 39 y.o. female.  HPI     This is a 39 year old female with a history of asthma, eczema, hypertension who presents with rash.  Patient reports she has had 2-week history of worsening rash over the palms, soles, arms, and scalp.  It is itchy and painful.  It is consistent with her prior eczema.  Unfortunately she cannot see her dermatologist.  She has been applying Benadryl cream and ointment as well as taking oral Benadryl with minimal relief.  She denies any fevers or infectious symptoms.  Rates her pain at 10 out of 10 and has been unable to sleep.  Denies any systemic symptoms, chest pain, shortness breath, abdominal pain.  Past Medical History:  Diagnosis Date  . Asthma   . Eczema   . Hypertension     There are no problems to display for this patient.   Past Surgical History:  Procedure Laterality Date  . CESAREAN SECTION    . KNEE SURGERY       OB History   No obstetric history on file.     No family history on file.  Social History   Tobacco Use  . Smoking status: Current Every Day Smoker    Packs/day: 0.50    Types: Cigarettes  . Smokeless tobacco: Never Used  Substance Use Topics  . Alcohol use: Yes    Comment: occasionally every Saturday, vodka 3 drinks  . Drug use: No    Home Medications Prior to Admission medications   Medication Sig Start Date End Date Taking? Authorizing Provider  acetaminophen (TYLENOL) 500 MG tablet Take 1,000 mg by mouth every 6 (six) hours as needed for mild pain or headache (headache).     [provider]  cephALEXin (KEFLEX) 500 MG capsule Take 1 capsule (500 mg total) by mouth 3 (three) times daily. Patient not taking: Reported on 03/01/2019 07/29/16   Jola Schmidt, MD  doxycycline (VIBRAMYCIN) 100 MG capsule  Take 1 capsule (100 mg total) by mouth 2 (two) times daily. Patient not taking: Reported on 03/01/2019 07/29/16   Jola Schmidt, MD  hydrochlorothiazide (HYDRODIURIL) 25 MG tablet Take 1 tablet (25 mg total) by mouth daily. 03/01/19 06/08/19  [name], PA-C  predniSONE (DELTASONE) 20 MG tablet Take 2 tablets (40 mg total) by mouth daily. 08/02/19   Smrithi Pigford, Barbette Hair, MD  triamcinolone ointment (KENALOG) 0.5 % Apply 1 application topically 2 (two) times daily. Do not apply to face or scalp 08/02/19   Derrel Moore, Barbette Hair, MD    Allergies    Brompheniramine  Review of Systems   Review of Systems  Constitutional: Negative for fever.  Respiratory: Negative for shortness of breath.   Cardiovascular: Negative for chest pain.  Gastrointestinal: Negative for abdominal pain, nausea and vomiting.  Skin: Positive for rash.  All other systems reviewed and are negative.   Physical Exam Updated Vital Signs BP (!) 150/106 (BP Location: Right Arm)   Pulse 76   Temp 98 F (36.7 C) (Oral)   Resp 16   Ht 1.778 m (5\' 10" )   Wt 72.6 kg   SpO2 100%   BMI 22.96 kg/m   Physical Exam Vitals and nursing note reviewed.  Constitutional:      Appearance: She is well-developed.  HENT:  Head: Normocephalic and atraumatic.     Comments: Excoriations and scattered scabbing across the scalp    Mouth/Throat:     Mouth: Mucous membranes are moist.  Eyes:     Pupils: Pupils are equal, round, and reactive to light.  Neck:     Comments: Posterior occipital adenopathy Cardiovascular:     Rate and Rhythm: Normal rate and regular rhythm.     Heart sounds: Normal heart sounds.  Pulmonary:     Effort: Pulmonary effort is normal. No respiratory distress.  Abdominal:     Palpations: Abdomen is soft.     Tenderness: There is no abdominal tenderness.  Musculoskeletal:     Cervical back: Neck supple.  Lymphadenopathy:     Cervical: Cervical adenopathy present.  Skin:    General: Skin is warm and dry.      Comments: Papular raised rash with excoriation and skin thickening noted over the palms, soles, extending upwards on the arms and the trunk, there is thickening between the digits of the fingers with skin discoloration  Neurological:     Mental Status: She is alert and oriented to person, place, and time.  Psychiatric:        Mood and Affect: Mood normal.     ED Results / Procedures / Treatments   Labs (all labs ordered are listed, but only abnormal results are displayed) Labs Reviewed - No data to display  EKG None  Radiology No results found.  Procedures Procedures (including critical care time)  Medications Ordered in ED Medications  hydrOXYzine (ATARAX/VISTARIL) tablet 25 mg (has no administration in time range)  predniSONE (DELTASONE) tablet 60 mg (has no administration in time range)    ED Course  I have reviewed the triage vital signs and the nursing notes.  Pertinent labs & imaging results that were available during my care of the patient were reviewed by me and considered in my medical decision making (see chart for details).    MDM Rules/Calculators/A&P                      Patient presents with rash consistent with her prior episodes of eczema.  She is overall nontoxic and vital signs are reassuring.  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  Low suspicion for infectious etiology although it does involve the palms and soles.  Patient is unable to see her dermatologist.  She has some reactive lymphadenopathy at the posterior neck likely related to irritation of the scalp.  Will prescribe high potency steroid Kenalog 0.5%.  Patient instructed not to apply this to the face of the scalp.  Given the extensive nature of her rash, will also place on 5-day course per steroids.  Recommend follow-up with dermatology.  After history, exam, and medical workup I feel the patient has been  appropriately medically screened and is safe for discharge home. Pertinent diagnoses were discussed with the patient. Patient was given return precautions.   Final Clinical Impression(s) / ED Diagnoses Final diagnoses:  Dyshidrotic eczema    Rx / DC Orders ED Discharge Orders         Ordered    triamcinolone ointment (KENALOG) 0.5 %  2 times daily     08/02/19 0522    predniSONE (DELTASONE) 20 MG tablet  Daily     08/02/19 0522           Dayjah Selman, Mayer Masker, MD  08/02/19 0528  

## 2019-08-02 NOTE — Discharge Instructions (Addendum)
You were seen today for your eczema.  Apply cream to your palms and soles as well as your body.  Avoid the head and neck.  You were given 1 dose of prednisone today.  Take 1 dose for the next 4 days at home.  Follow-up closely with your primary physician.

## 2021-07-02 ENCOUNTER — Encounter (HOSPITAL_COMMUNITY): Payer: Self-pay

## 2021-07-02 ENCOUNTER — Other Ambulatory Visit: Payer: Self-pay

## 2021-07-02 ENCOUNTER — Emergency Department (HOSPITAL_COMMUNITY)
Admission: EM | Admit: 2021-07-02 | Discharge: 2021-07-02 | Disposition: A | Discharge status: Home / Self Care | Payer: Self-pay | Attending: Emergency Medicine | Admitting: Emergency Medicine

## 2021-07-02 DIAGNOSIS — L309 Dermatitis, unspecified: Secondary | ICD-10-CM | POA: Insufficient documentation

## 2021-07-02 MED ORDER — PREDNISONE 20 MG PO TABS: ORAL_TABLET | ORAL | 0 refills | Status: AC

## 2021-07-02 MED ORDER — TRIAMCINOLONE ACETONIDE 0.5 % EX OINT: 1.0000 "application " | TOPICAL_OINTMENT | Freq: Two times a day (BID) | CUTANEOUS | 0 refills | Status: AC | PRN

## 2021-07-02 NOTE — ED Triage Notes (Signed)
Pt complaining of her eczema getting out of control and breaking out all over her body despite treatment. This has been getting worse for the last 30 days. ?

## 2021-07-02 NOTE — ED Provider Notes (Addendum)
?[facility] ?Provider Note ? ? ?CSN: [id number] ?Arrival date & time: 07/02/21  [id number] ? ?  ? ?History ? ?Chief Complaint  ?Patient presents with  ? Rash  ? ? ?Cathy Hill is a 41 y.o. female. ? ?The history is provided by the patient and medical records. No language interpreter was used.  ?Rash ? ?41 year old female significant history of eczema presenting complaining of a rash.  Patient states she has eczema all throughout her life.  She normally has flareup around springtime more than usual.  She noticed for the past month she has had worsening eczema in which she complains of itchy skin and rash throughout her body.  Eczema usually is improved with using some over-the-counter medication and avoidance of certain types of food but she admits that her eating habit has not been quite as good lately which she felt could contribute to her symptoms.  She does not complain of any fever, throat swelling, trouble breathing, abdominal cramping.  She denies anyone else at home with similar rash.  She did try to treat at home for her symptoms but she felt it is not well controlled.  At the moment she does not have a PCP due to lack of insurance but states she should be able to have new insurance in 60 days.  No other environmental changes ? ?Home Medications ?Prior to Admission medications   ?Medication Sig Start Date End Date Taking? Authorizing Provider  ?acetaminophen (TYLENOL) 500 MG tablet Take 1,000 mg by mouth every 6 (six) hours as needed for mild pain or headache (headache).    Yes [provider]  ?diphenhydrAMINE (BENADRYL) 25 MG tablet Take 50-75 mg by mouth every 6 (six) hours as needed for itching.   Yes [provider]  ?diphenhydrAMINE-zinc acetate (BENADRYL) cream Apply 1 application. topically 3 (three) times daily as needed for itching.   Yes [provider]  ?hydrocortisone cream 0.5 % Apply 1 application. topically 2 (two) times  daily as needed for itching.   Yes [provider]  ?ibuprofen (ADVIL) 200 MG tablet Take 600 mg by mouth every 6 (six) hours as needed for headache or moderate pain.   Yes [provider]  ?triamcinolone ointment (KENALOG) 0.5 % Apply 1 application topically 2 (two) times daily. Do not apply to face or scalp ?Patient taking differently: Apply 1 application. topically 2 (two) times daily as needed (rash). 08/02/19  Yes Horton, Barbette Hair, MD  ?   ? ?Allergies    ?Brompheniramine   ? ?Review of Systems   ?Review of Systems  ?Skin:  Positive for rash.  ?All other systems reviewed and are negative. ? ?Physical Exam ?Updated Vital Signs ?BP (!) 158/102   Pulse 75   Temp (!) 97.5 ?F (36.4 ?C) (Oral)   Resp 18   LMP  (LMP Unknown)   SpO2 100%  ?Physical Exam ?Vitals and nursing note reviewed.  ?Constitutional:   ?   General: She is not in acute distress. ?   Appearance: She is well-developed.  ?HENT:  ?   Head: Atraumatic.  ?Eyes:  ?   Conjunctiva/sclera: Conjunctivae normal.  ?Cardiovascular:  ?   Rate and Rhythm: Normal rate and regular rhythm.  ?Pulmonary:  ?   Effort: Pulmonary effort is normal.  ?   Breath sounds: No wheezing.  ?Abdominal:  ?   Palpations: Abdomen is soft.  ?   Tenderness: There is no abdominal tenderness.  ?Musculoskeletal:  ?  Cervical back: Neck supple.  ?Skin: ?   Findings: No rash (Patient has multiple raised scattered hyperpigmented rash noted throughout body including arms and legs without signs of infection).  ?Neurological:  ?   Mental Status: She is alert.  ?Psychiatric:     ?   Mood and Affect: Mood normal.  ? ? ?ED Results / Procedures / Treatments   ?Labs ?(all labs ordered are listed, but only abnormal results are displayed) ?Labs Reviewed - No data to display ? ?EKG ?None ? ?Radiology ?No results found. ? ?Procedures ?Procedures  ? ? ?Medications Ordered in ED ?Medications - No data to display ? ?ED Course/ Medical Decision Making/ A&P ?  ?                         ?Medical Decision Making ?Risk ?Prescription drug management. ? ? ?BP (!) 158/102   Pulse 75   Temp (!) 97.5 ?F (36.4 ?C) (Oral)   Resp 18   LMP  (LMP Unknown)   SpO2 100%  ? ?6:48 AM ?This is a 41 year old female significant history of eczema presenting with worsening rash consistent with a flare of her eczema.  No obvious signs to suggest infectious rash, no systemic manifestation, patient overall well-appearing.  I have low suspicion for concerning rash such as SJS, TEN.  At this time, recommend patient to avoid any potential triggers such as tomatoes as patient has had eczema flare from certain types of food.  I will also prescribe oral prednisone course as well as steroid cream but recommend to avoid usage of the face.  I also strongly encourage patient to follow-up with dermatologist for outpatient management of her condition.  Patient made aware to return if she develop any systemic symptoms or if she has signs of infection or if she has any concern.  Patient voiced understanding and agrees with plan. ? ? ? ? ? ? ? ? ? ?Final Clinical Impression(s) / ED Diagnoses ?Final diagnoses:  ?Eczema, unspecified type  ? ? ?Rx / DC Orders ?ED Discharge Orders   ? ?      Ordered  ?  triamcinolone ointment (KENALOG) 0.5 %  2 times daily PRN       ? 07/02/21 0657  ?  predniSONE (DELTASONE) 20 MG tablet       ? 07/02/21 0657  ? ?  ?  ? ?  ? ? ?  ?Domenic Moras, PA-C ?07/02/21 0700 ? ?  ?Domenic Moras, PA-C ?07/02/21 [id number] ? ?  ?Godfrey Pick, MD ?07/02/21 1831 ? ?

## 2021-07-02 NOTE — Discharge Instructions (Signed)
Please take prednisone as prescribed for your symptoms.  You may also apply steroid cream to affected area as needed for itchiness but avoid using on face or neck.  Call and follow-up closely with a dermatologist for further managements of condition.  Monitor your diet and avoid food that may trigger or worsening your symptoms.  Return if you have any concern. ?
